# Patient Record
Sex: Male | Born: 1950
Health system: Southern US, Community
[De-identification: ages and names within clinical notes are randomized; demographics above are authoritative.]

## PROBLEM LIST (undated history)

## (undated) DIAGNOSIS — I639 Cerebral infarction, unspecified: Secondary | ICD-10-CM

## (undated) DIAGNOSIS — E119 Type 2 diabetes mellitus without complications: Secondary | ICD-10-CM

## (undated) DIAGNOSIS — I10 Essential (primary) hypertension: Secondary | ICD-10-CM

## (undated) DIAGNOSIS — K649 Unspecified hemorrhoids: Secondary | ICD-10-CM

## (undated) DIAGNOSIS — M1990 Unspecified osteoarthritis, unspecified site: Secondary | ICD-10-CM

## (undated) DIAGNOSIS — K219 Gastro-esophageal reflux disease without esophagitis: Secondary | ICD-10-CM

## (undated) DIAGNOSIS — E785 Hyperlipidemia, unspecified: Secondary | ICD-10-CM

## (undated) HISTORY — DX: Type 2 diabetes mellitus without complications: E11.9

## (undated) HISTORY — PX: TONSILLECTOMY: SUR1361

## (undated) HISTORY — DX: Essential (primary) hypertension: I10

## (undated) HISTORY — DX: Cerebral infarction, unspecified: I63.9

## (undated) HISTORY — DX: Unspecified osteoarthritis, unspecified site: M19.90

## (undated) HISTORY — DX: Gastro-esophageal reflux disease without esophagitis: K21.9

## (undated) HISTORY — DX: Hyperlipidemia, unspecified: E78.5

## (undated) HISTORY — DX: Unspecified hemorrhoids: K64.9

---

## 2014-10-27 ENCOUNTER — Encounter: Payer: Self-pay | Admitting: General Surgery

## 2014-10-27 ENCOUNTER — Ambulatory Visit (INDEPENDENT_AMBULATORY_CARE_PROVIDER_SITE_OTHER): Payer: Medicare Other | Admitting: General Surgery

## 2014-10-27 VITALS — BP 144/80 | HR 70 | Temp 97.1°F | Resp 12 | Ht 70.0 in | Wt 195.0 lb

## 2014-10-27 DIAGNOSIS — K610 Anal abscess: Secondary | ICD-10-CM

## 2014-10-27 MED ORDER — DOXYCYCLINE HYCLATE 100 MG PO CAPS: 100.0000 mg | ORAL_CAPSULE | Freq: Two times a day (BID) | ORAL | Status: DC

## 2014-10-27 NOTE — Patient Instructions (Addendum)
The patient is aware to call back for any questions or concerns. Soak in warm water- SITZ bath Use pad for drainage

## 2014-10-27 NOTE — Progress Notes (Signed)
Patient ID: Richard SCHLARB Sr., male   DOB: 04-Jun-1951, 63 y.o.   MRN: [id number]  Chief Complaint  Patient presents with  . Rectal Problems    abscess    HPI Richard MOFIELD Sr. is a 63 y.o. male.  Here today for evaluation of a perirectal abscess. States he has had trouble with this for about 1 week. He has had constipation and trouble with hemorrhoids in the past. No colonoscopy. He states it started draining around 4am.  HPI  Past Medical History  Diagnosis Date  . Arthritis   . GERD (gastroesophageal reflux disease)   . Diabetes mellitus without complication   . Hemorrhoid     Past Surgical History  Procedure Laterality Date  . Tonsillectomy      Family History  Problem Relation Age of Onset  . Cancer Father   . Cancer Mother   . Colon polyps Father     Social History History  Substance Use Topics  . Smoking status: Former Smoker    Quit date: 12/18/2005  . Smokeless tobacco: Not on file  . Alcohol Use: 0.0 oz/week    0 Not specified per week     Comment: occasionally    No Known Allergies  Current Outpatient Prescriptions  Medication Sig Dispense Refill  . ibuprofen (ADVIL,MOTRIN) 800 MG tablet Take 800 mg by mouth 2 (two) times daily.    . metFORMIN (GLUCOPHAGE) 500 MG tablet Take by mouth 2 (two) times daily with a meal.    . doxycycline (VIBRAMYCIN) 100 MG capsule Take 1 capsule (100 mg total) by mouth 2 (two) times daily. 14 capsule 0   No current facility-administered medications for this visit.    Review of Systems Review of Systems  Constitutional: Negative.   Respiratory: Negative.   Cardiovascular: Negative.     Blood pressure 144/80, pulse 70, temperature 97.1 F (36.2 C), resp. rate 12, height 5\' 10"  (1.778 m), weight 195 lb (88.451 kg).  Physical Exam Physical Exam  Constitutional: He appears well-developed and well-nourished.  Eyes: Conjunctivae are normal. No scleral icterus.  Neck: Neck supple.  Cardiovascular: Normal rate, regular  rhythm and normal heart sounds.   Pulmonary/Chest: Effort normal and breath sounds normal.  Abdominal: Soft. Normal appearance and bowel sounds are normal. There is no tenderness. No hernia.  Genitourinary:  Perianal area with mild swelling at 6-7 position, one open area in middle with drainage  Lymphadenopathy:    He has no cervical adenopathy.    Data Reviewed none  Assessment    Perianal abscess. Drained spontaneously.    Plan    Soak in warm water- SITZ bath BID. Use pad for drainage as needed. Follow up in 4-6 weeks. Complete antibiotic therapy-1 week of Doxycycline 100mg  bid  Plan for colonoscopy when area healed.     PCP:  Othelia Pulling Justain  Trica Usery G 10/27/2014, 8:40 AM

## 2014-11-18 ENCOUNTER — Ambulatory Visit (INDEPENDENT_AMBULATORY_CARE_PROVIDER_SITE_OTHER): Payer: Medicare Other | Admitting: General Surgery

## 2014-11-18 ENCOUNTER — Encounter: Payer: Self-pay | Admitting: General Surgery

## 2014-11-18 VITALS — BP 130/76 | HR 76 | Resp 12 | Ht 70.0 in | Wt 198.0 lb

## 2014-11-18 DIAGNOSIS — Z1211 Encounter for screening for malignant neoplasm of colon: Secondary | ICD-10-CM

## 2014-11-18 DIAGNOSIS — K603 Anal fistula: Secondary | ICD-10-CM

## 2014-11-18 DIAGNOSIS — K610 Anal abscess: Secondary | ICD-10-CM

## 2014-11-18 MED ORDER — POLYETHYLENE GLYCOL 3350 17 GM/SCOOP PO POWD
ORAL | Status: DC
Start: 2014-11-18 — End: 2015-04-30

## 2014-11-18 NOTE — Progress Notes (Signed)
Patient ID: Richard SWICEGOOD Sr., male   DOB: Aug 10, 1951, 63 y.o.   MRN: [id number]  Chief Complaint  Patient presents with  . Other    peri anal abscess    HPI Richard BANAS Sr. is a 63 y.o. male here today following up with a perianal abscess. Patient states the area began draining again Friday night. The area stopped  draining last night. Pain is significantly decreased from last visit. Patient has finished antibiotic                                                                    HPI  Past Medical History  Diagnosis Date  . Arthritis   . GERD (gastroesophageal reflux disease)   . Diabetes mellitus without complication   . Hemorrhoid     Past Surgical History  Procedure Laterality Date  . Tonsillectomy      Family History  Problem Relation Age of Onset  . Lung cancer Father   . Ovarian cancer Mother   . Colon polyps Father     Social History History  Substance Use Topics  . Smoking status: Former Smoker    Quit date: 12/18/2005  . Smokeless tobacco: Not on file  . Alcohol Use: 0.0 oz/week    0 Not specified per week     Comment: occasionally    No Known Allergies  Current Outpatient Prescriptions  Medication Sig Dispense Refill  . doxycycline (VIBRAMYCIN) 100 MG capsule Take 1 capsule (100 mg total) by mouth 2 (two) times daily. 14 capsule 0  . ibuprofen (ADVIL,MOTRIN) 800 MG tablet Take 800 mg by mouth 2 (two) times daily.    . metFORMIN (GLUCOPHAGE) 500 MG tablet Take by mouth 2 (two) times daily with a meal.     No current facility-administered medications for this visit.    Review of Systems Review of Systems  Constitutional: Negative.   Respiratory: Negative.     Blood pressure 130/76, pulse 76, resp. rate 12, height 5\' 10"  (1.778 m), weight 198 lb (89.812 kg).  Physical Exam Physical Exam  Constitutional: He is oriented to person, place, and time. He appears well-developed and well-nourished.  Eyes: Conjunctivae are normal. No scleral icterus.   Neck: Neck supple.  Cardiovascular: Normal rate, regular rhythm and normal heart sounds.   Pulmonary/Chest: Effort normal and breath sounds normal.  Abdominal: Soft. Bowel sounds are normal.  Genitourinary:  Small opening at Levasy. One cm from anal os Scant induration underlying this  Neurological: He is alert and oriented to person, place, and time.  Skin: Skin is warm and dry.    Data Reviewed Notes reviewed  Assessment    Anal fistula likely superficial   patient has not had a screening colonoscopy. I feel this can be done at same time as anal fistulotomy  Plan     Patient to have colonoscopy and anal fistulotomy. Both to be completed in the O.R.  Discussed risks and benefits and patient is agreeable to this. Procedure explained to him  Patient has been scheduled for a colonoscopy and surgery on 12-04-14 at [facility]. This patient has been asked to hold metformin day of colonoscopy prep and procedure.        Aubryana Vittorio G 11/18/2014, 2:11 PM

## 2014-11-18 NOTE — Patient Instructions (Addendum)
Colonoscopy A colonoscopy is an exam to look at the entire large intestine (colon). This exam can help find problems such as tumors, polyps, inflammation, and areas of bleeding. The exam takes about 1 hour.  LET [facility] CARE PROVIDER KNOW ABOUT:   Any allergies you have.  All medicines you are taking, including vitamins, herbs, eye drops, creams, and over-the-counter medicines.  Previous problems you or members of your family have had with the use of anesthetics.  Any blood disorders you have.  Previous surgeries you have had.  Medical conditions you have. RISKS AND COMPLICATIONS  Generally, this is a safe procedure. However, as with any procedure, complications can occur. Possible complications include:  Bleeding.  Tearing or rupture of the colon wall.  Reaction to medicines given during the exam.  Infection (rare). BEFORE THE PROCEDURE   Ask your health care provider about changing or stopping your regular medicines.  You may be prescribed an oral bowel prep. This involves drinking a large amount of medicated liquid, starting the day before your procedure. The liquid will cause you to have multiple loose stools until your stool is almost clear or light green. This cleans out your colon in preparation for the procedure.  Do not eat or drink anything else once you have started the bowel prep, unless your health care provider tells you it is safe to do so.  Arrange for someone to drive you home after the procedure. PROCEDURE   You will be given medicine to help you relax (sedative).  You will lie on your side with your knees bent.  A long, flexible tube with a light and camera on the end (colonoscope) will be inserted through the rectum and into the colon. The camera sends video back to a computer screen as it moves through the colon. The colonoscope also releases carbon dioxide gas to inflate the colon. This helps your health care provider see the area better.  During  the exam, your health care provider may take a small tissue sample (biopsy) to be examined under a microscope if any abnormalities are found.  The exam is finished when the entire colon has been viewed. AFTER THE PROCEDURE   Do not drive for 24 hours after the exam.  You may have a small amount of blood in your stool.  You may pass moderate amounts of gas and have mild abdominal cramping or bloating. This is caused by the gas used to inflate your colon during the exam.  Ask when your test results will be ready and how you will get your results. Make sure you get your test results. Document Released: 12/01/2000 Document Revised: 09/24/2013 Document Reviewed: 08/11/2013 [facility] Patient Information 2015 [place], [place]. This information is not intended to replace advice given to you by your health care provider. Make sure you discuss any questions you have with your health care provider.  Patient has been scheduled for a colonoscopy and surgery on 12-04-14 at [facility]. This patient has been asked to hold metformin day of colonoscopy prep and procedure.

## 2014-11-24 ENCOUNTER — Ambulatory Visit: Payer: Medicare Other | Admitting: General Surgery

## 2014-11-26 ENCOUNTER — Ambulatory Visit: Payer: Self-pay | Admitting: General Surgery

## 2014-11-26 LAB — BASIC METABOLIC PANEL
Anion Gap: 9 (ref 7–16)
BUN: 12 mg/dL (ref 7–18)
CALCIUM: 8.8 mg/dL (ref 8.5–10.1)
CHLORIDE: 101 mmol/L (ref 98–107)
Co2: 26 mmol/L (ref 21–32)
Creatinine: 0.98 mg/dL (ref 0.60–1.30)
EGFR (African American): >60
EGFR (Non-African Amer.): >60
GLUCOSE: 300 mg/dL — AB (ref 65–99)
Osmolality: 283 (ref 275–301)
Potassium: 4.4 mmol/L (ref 3.5–5.1)
Sodium: 136 mmol/L (ref 136–145)

## 2014-11-30 ENCOUNTER — Telehealth: Payer: Self-pay | Admitting: *Deleted

## 2014-11-30 NOTE — Telephone Encounter (Signed)
Per Judeen Hammans with anesthesia, patient will require medical clearance from Othelia Pulling, PA before we can proceed with colonoscopy and anal fistulotomy scheduled for 12-04-14. This is due to recent abnormal labs completed at time of pre-admission on 11-26-14. Glucose was noted to be 300 mg/dL.  Othelia Pulling, PA was also contacted this morning to confirm he received paperwork for medical clearance request and he confirms. He recommends that we cancel surgery at this time as patient will most likely not be cleared for surgery for Friday of this week.   Message was left on the O. R. posting line to cancel surgery for 12-04-14.  Patient was contacted this morning and made aware accordingly. This patient was instructed to follow up with his PCP. Also, aware we will need clearance before surgery can be rescheduled. Patient verbalizes understanding.

## 2014-12-30 ENCOUNTER — Encounter: Payer: Self-pay | Admitting: General Surgery

## 2015-02-23 ENCOUNTER — Telehealth: Payer: Self-pay | Admitting: *Deleted

## 2015-02-23 NOTE — Telephone Encounter (Signed)
Pts friend called and was wondering why there was not surgery schedule for the patient, I told her the reason and they both understand that he needs to follow up with his PCP and we will also need clearance before we can reschedule his surgery

## 2015-04-21 ENCOUNTER — Ambulatory Visit (INDEPENDENT_AMBULATORY_CARE_PROVIDER_SITE_OTHER): Payer: Medicare Other | Admitting: General Surgery

## 2015-04-21 ENCOUNTER — Encounter: Payer: Self-pay | Admitting: General Surgery

## 2015-04-21 VITALS — BP 148/84 | HR 80 | Resp 12 | Ht 70.0 in | Wt 199.0 lb

## 2015-04-21 DIAGNOSIS — K603 Anal fistula: Secondary | ICD-10-CM

## 2015-04-21 DIAGNOSIS — Z8371 Family history of colonic polyps: Secondary | ICD-10-CM | POA: Diagnosis not present

## 2015-04-21 NOTE — Progress Notes (Signed)
Patient ID: Richard DEBRUYNE Sr., male   DOB: 06/30/1951, 64 y.o.   MRN: [id number]  Chief Complaint  Patient presents with  . Pre-op Exam    HPI Richard ERAZO Sr. is a 64 y.o. male.  Here today for pre-op colonoscopy and anal fistulotomy scheduled for 04-30-15. He was initially scheduled for this in Dec 2015 but he had poor control of diabetes at that time.FSBS have been stable- average 128 in the mornings. He went fishing last week and started bleeding again. Otherwise he is doing well  HPI  Past Medical History  Diagnosis Date  . Arthritis   . GERD (gastroesophageal reflux disease)   . Diabetes mellitus without complication   . Hemorrhoid     Past Surgical History  Procedure Laterality Date  . Tonsillectomy      Family History  Problem Relation Age of Onset  . Lung cancer Father   . Ovarian cancer Mother   . Colon polyps Father     Social History History  Substance Use Topics  . Smoking status: Former Smoker    Quit date: 12/18/2005  . Smokeless tobacco: Never Used  . Alcohol Use: 0.0 oz/week    0 Standard drinks or equivalent per week     Comment: occasionally    No Known Allergies  Current Outpatient Prescriptions  Medication Sig Dispense Refill  . ibuprofen (ADVIL,MOTRIN) 800 MG tablet Take 800 mg by mouth 2 (two) times daily.    . metFORMIN (GLUCOPHAGE) 500 MG tablet Take 1,000 mg by mouth 2 (two) times daily with a meal.     . polyethylene glycol powder (GLYCOLAX/MIRALAX) powder 255 grams one bottle for colonoscopy prep 255 g 0   No current facility-administered medications for this visit.    Review of Systems Review of Systems  Constitutional: Negative.   Respiratory: Negative.   Cardiovascular: Negative.     Blood pressure 148/84, pulse 80, resp. rate 12, height 5\' 10"  (1.778 m), weight 199 lb (90.266 kg).  Physical Exam Physical Exam  Constitutional: He is oriented to person, place, and time. He appears well-developed and well-nourished.  Eyes:  Conjunctivae are normal. No scleral icterus.  Neck: Neck supple.  Cardiovascular: Normal rate, regular rhythm and normal heart sounds.   Pulmonary/Chest: Effort normal and breath sounds normal.  Abdominal: Soft. Normal appearance. There is no tenderness. No hernia.  Genitourinary:  External opening is again noted at 6-7 o'clock, position fairly close to anal opening.  Lymphadenopathy:    He has no cervical adenopathy.  Neurological: He is alert and oriented to person, place, and time.  Skin: Skin is warm and dry.    Data Reviewed Prior notes.  Assessment    Anal fistula, family history colon polyps. Diabetes under better control at present being followed by Othelia Pulling PA.    Plan    Needs screening colonosocpy and anal fistula surgery at same sitting in the O.R. This has previously been discussed with him.  Patient's procedure has been scheduled for 04-30-15 at [facility].      PCP:  Charm Rings 04/21/2015, 10:15 AM

## 2015-04-21 NOTE — Patient Instructions (Addendum)
Colonoscopy A colonoscopy is an exam to look at the entire large intestine (colon). This exam can help find problems such as tumors, polyps, inflammation, and areas of bleeding. The exam takes about 1 hour.  LET [facility] CARE PROVIDER KNOW ABOUT:   Any allergies you have.  All medicines you are taking, including vitamins, herbs, eye drops, creams, and over-the-counter medicines.  Previous problems you or members of your family have had with the use of anesthetics.  Any blood disorders you have.  Previous surgeries you have had.  Medical conditions you have. RISKS AND COMPLICATIONS  Generally, this is a safe procedure. However, as with any procedure, complications can occur. Possible complications include:  Bleeding.  Tearing or rupture of the colon wall.  Reaction to medicines given during the exam.  Infection (rare). BEFORE THE PROCEDURE   Ask your health care provider about changing or stopping your regular medicines.  You may be prescribed an oral bowel prep. This involves drinking a large amount of medicated liquid, starting the day before your procedure. The liquid will cause you to have multiple loose stools until your stool is almost clear or light green. This cleans out your colon in preparation for the procedure.  Do not eat or drink anything else once you have started the bowel prep, unless your health care provider tells you it is safe to do so.  Arrange for someone to drive you home after the procedure. PROCEDURE   You will be given medicine to help you relax (sedative).  You will lie on your side with your knees bent.  A long, flexible tube with a light and camera on the end (colonoscope) will be inserted through the rectum and into the colon. The camera sends video back to a computer screen as it moves through the colon. The colonoscope also releases carbon dioxide gas to inflate the colon. This helps your health care provider see the area better.  During  the exam, your health care provider may take a small tissue sample (biopsy) to be examined under a microscope if any abnormalities are found.  The exam is finished when the entire colon has been viewed. AFTER THE PROCEDURE   Do not drive for 24 hours after the exam.  You may have a small amount of blood in your stool.  You may pass moderate amounts of gas and have mild abdominal cramping or bloating. This is caused by the gas used to inflate your colon during the exam.  Ask when your test results will be ready and how you will get your results. Make sure you get your test results. Document Released: 12/01/2000 Document Revised: 09/24/2013 Document Reviewed: 08/11/2013 [facility] Patient Information 2015 [place], [place]. This information is not intended to replace advice given to you by your health care provider. Make sure you discuss any questions you have with your health care provider.  Patient's procedure has been scheduled for 04-30-15 at [facility].

## 2015-04-22 ENCOUNTER — Encounter
Admission: RE | Admit: 2015-04-22 | Discharge: 2015-04-22 | Disposition: A | Discharge status: Home / Self Care | Payer: Medicare Other | Source: Ambulatory Visit | Attending: General Surgery | Admitting: General Surgery

## 2015-04-22 ENCOUNTER — Other Ambulatory Visit: Payer: Self-pay | Admitting: General Surgery

## 2015-04-22 DIAGNOSIS — Z01812 Encounter for preprocedural laboratory examination: Secondary | ICD-10-CM | POA: Insufficient documentation

## 2015-04-22 DIAGNOSIS — E119 Type 2 diabetes mellitus without complications: Secondary | ICD-10-CM | POA: Insufficient documentation

## 2015-04-22 LAB — BASIC METABOLIC PANEL
ANION GAP: 11 (ref 5–15)
BUN: 18 mg/dL (ref 6–20)
CHLORIDE: 100 mmol/L — AB (ref 101–111)
CO2: 25 mmol/L (ref 22–32)
Calcium: 9.4 mg/dL (ref 8.9–10.3)
Creatinine, Ser: 0.94 mg/dL (ref 0.61–1.24)
GFR calc Af Amer: >60 mL/min (ref >60)
GFR calc non Af Amer: >60 mL/min (ref >60)
Glucose, Bld: 147 mg/dL — ABNORMAL HIGH (ref 65–99)
POTASSIUM: 4.4 mmol/L (ref 3.5–5.1)
Sodium: 136 mmol/L (ref 135–145)

## 2015-04-30 ENCOUNTER — Ambulatory Visit: Payer: Medicare Other | Admitting: Anesthesiology

## 2015-04-30 ENCOUNTER — Encounter: Payer: Self-pay | Admitting: *Deleted

## 2015-04-30 ENCOUNTER — Ambulatory Visit
Admission: RE | Admit: 2015-04-30 | Discharge: 2015-04-30 | Disposition: A | Discharge status: Home / Self Care | Payer: Medicare Other | Source: Ambulatory Visit | Attending: General Surgery | Admitting: General Surgery

## 2015-04-30 ENCOUNTER — Encounter
Admission: RE | Disposition: A | Discharge status: Home / Self Care | Payer: Self-pay | Source: Ambulatory Visit | Attending: General Surgery

## 2015-04-30 DIAGNOSIS — Z800 Family history of malignant neoplasm of digestive organs: Secondary | ICD-10-CM | POA: Insufficient documentation

## 2015-04-30 DIAGNOSIS — Z801 Family history of malignant neoplasm of trachea, bronchus and lung: Secondary | ICD-10-CM | POA: Diagnosis not present

## 2015-04-30 DIAGNOSIS — Z1211 Encounter for screening for malignant neoplasm of colon: Secondary | ICD-10-CM | POA: Insufficient documentation

## 2015-04-30 DIAGNOSIS — E1165 Type 2 diabetes mellitus with hyperglycemia: Secondary | ICD-10-CM | POA: Diagnosis not present

## 2015-04-30 DIAGNOSIS — D123 Benign neoplasm of transverse colon: Secondary | ICD-10-CM | POA: Insufficient documentation

## 2015-04-30 DIAGNOSIS — K219 Gastro-esophageal reflux disease without esophagitis: Secondary | ICD-10-CM | POA: Diagnosis not present

## 2015-04-30 DIAGNOSIS — M1990 Unspecified osteoarthritis, unspecified site: Secondary | ICD-10-CM | POA: Diagnosis not present

## 2015-04-30 DIAGNOSIS — K603 Anal fistula: Secondary | ICD-10-CM | POA: Insufficient documentation

## 2015-04-30 DIAGNOSIS — Z8371 Family history of colonic polyps: Secondary | ICD-10-CM | POA: Diagnosis not present

## 2015-04-30 DIAGNOSIS — Z87891 Personal history of nicotine dependence: Secondary | ICD-10-CM | POA: Diagnosis not present

## 2015-04-30 DIAGNOSIS — K621 Rectal polyp: Secondary | ICD-10-CM | POA: Insufficient documentation

## 2015-04-30 DIAGNOSIS — Z79899 Other long term (current) drug therapy: Secondary | ICD-10-CM | POA: Insufficient documentation

## 2015-04-30 HISTORY — PX: COLONOSCOPY: SHX5424

## 2015-04-30 HISTORY — PX: ANAL FISTULOTOMY: SHX6423

## 2015-04-30 LAB — GLUCOSE, CAPILLARY
GLUCOSE-CAPILLARY: 154 mg/dL — AB (ref 65–99)
Glucose-Capillary: 138 mg/dL — ABNORMAL HIGH (ref 65–99)

## 2015-04-30 SURGERY — COLONOSCOPY
Anesthesia: General

## 2015-04-30 MED ORDER — BUPIVACAINE HCL (PF) 0.5 % IJ SOLN
INTRAMUSCULAR | Status: AC
  Filled 2015-04-30: qty 30

## 2015-04-30 MED ORDER — OXYCODONE-ACETAMINOPHEN 5-325 MG PO TABS: 1.0000 | ORAL_TABLET | ORAL | Status: DC | PRN

## 2015-04-30 MED ORDER — ACETAMINOPHEN 10 MG/ML IV SOLN
INTRAVENOUS | Status: AC
  Administered 2015-04-30: 1000 mg via INTRAVENOUS
  Filled 2015-04-30: qty 100

## 2015-04-30 MED ORDER — FENTANYL CITRATE (PF) 100 MCG/2ML IJ SOLN
25.0000 ug | INTRAMUSCULAR | Status: DC | PRN
  Administered 2015-04-30 (×2): 25 ug via INTRAVENOUS

## 2015-04-30 MED ORDER — FENTANYL CITRATE (PF) 100 MCG/2ML IJ SOLN
INTRAMUSCULAR | Status: DC | PRN
  Administered 2015-04-30 (×3): 50 ug via INTRAVENOUS

## 2015-04-30 MED ORDER — BUPIVACAINE LIPOSOME 1.3 % IJ SUSP
INTRAMUSCULAR | Status: AC
  Filled 2015-04-30: qty 20

## 2015-04-30 MED ORDER — BUPIVACAINE LIPOSOME 1.3 % IJ SUSP
INTRAMUSCULAR | Status: DC | PRN
  Administered 2015-04-30: 20 mL

## 2015-04-30 MED ORDER — ONDANSETRON HCL 4 MG/2ML IJ SOLN
INTRAMUSCULAR | Status: AC
  Filled 2015-04-30: qty 2

## 2015-04-30 MED ORDER — SODIUM CHLORIDE 0.9 % IV SOLN
INTRAVENOUS | Status: DC
  Administered 2015-04-30: 07:00:00 via INTRAVENOUS

## 2015-04-30 MED ORDER — DEXAMETHASONE SODIUM PHOSPHATE 10 MG/ML IJ SOLN
INTRAMUSCULAR | Status: DC | PRN
  Administered 2015-04-30: 5 mg via INTRAVENOUS

## 2015-04-30 MED ORDER — ONDANSETRON HCL 4 MG/2ML IJ SOLN
INTRAMUSCULAR | Status: DC | PRN
  Administered 2015-04-30: 4 mg via INTRAVENOUS

## 2015-04-30 MED ORDER — PHENYLEPHRINE HCL 10 MG/ML IJ SOLN
INTRAMUSCULAR | Status: DC | PRN
  Administered 2015-04-30: 200 ug via INTRAVENOUS

## 2015-04-30 MED ORDER — ONDANSETRON HCL 4 MG/2ML IJ SOLN
4.0000 mg | Freq: Once | INTRAMUSCULAR | Status: AC | PRN
  Administered 2015-04-30: 4 mg via INTRAVENOUS

## 2015-04-30 MED ORDER — LIDOCAINE HCL (PF) 1 % IJ SOLN
INTRAMUSCULAR | Status: AC
  Filled 2015-04-30: qty 30

## 2015-04-30 MED ORDER — BACIT-POLY-NEO HC 1 % EX OINT
TOPICAL_OINTMENT | CUTANEOUS | Status: DC | PRN
  Administered 2015-04-30: 1 via TOPICAL

## 2015-04-30 MED ORDER — LIDOCAINE HCL (CARDIAC) 20 MG/ML IV SOLN
INTRAVENOUS | Status: DC | PRN
  Administered 2015-04-30: 50 mg via INTRAVENOUS

## 2015-04-30 MED ORDER — FENTANYL CITRATE (PF) 100 MCG/2ML IJ SOLN
INTRAMUSCULAR | Status: AC
  Administered 2015-04-30: 25 ug via INTRAVENOUS
  Filled 2015-04-30: qty 2

## 2015-04-30 MED ORDER — BACITRACIN ZINC 500 UNIT/GM EX OINT
TOPICAL_OINTMENT | CUTANEOUS | Status: AC
  Filled 2015-04-30: qty 28.35

## 2015-04-30 MED ORDER — PROPOFOL 10 MG/ML IV BOLUS
INTRAVENOUS | Status: DC | PRN
  Administered 2015-04-30: 150 mg via INTRAVENOUS
  Administered 2015-04-30: 50 mg via INTRAVENOUS

## 2015-04-30 MED ORDER — MIDAZOLAM HCL 5 MG/5ML IJ SOLN
INTRAMUSCULAR | Status: DC | PRN
  Administered 2015-04-30: 1 mg via INTRAVENOUS

## 2015-04-30 MED ORDER — LACTATED RINGERS IV SOLN
INTRAVENOUS | Status: DC | PRN
  Administered 2015-04-30 (×2): via INTRAVENOUS

## 2015-04-30 SURGICAL SUPPLY — 24 items
BLADE SURG 11 STRL SS SAFETY (MISCELLANEOUS) ×3 IMPLANT
BLADE SURG 15 STRL SS SAFETY (BLADE) ×3 IMPLANT
BRIEF STRETCH MATERNITY 2XLG (MISCELLANEOUS) ×3 IMPLANT
CANISTER SUCT 1200ML W/VALVE (MISCELLANEOUS) ×3 IMPLANT
DRAPE LEGGINS SURG 28X43 STRL (DRAPES) ×3 IMPLANT
DRAPE PED LAPAROTOMY (DRAPES) ×3 IMPLANT
DRAPE UNDER BUTTOCK W/FLU (DRAPES) ×3 IMPLANT
GLOVE BIO SURGEON STRL SZ7 (GLOVE) ×9 IMPLANT
GOWN STRL REUS W/ TWL LRG LVL3 (GOWN DISPOSABLE) ×2 IMPLANT
GOWN STRL REUS W/TWL LRG LVL3 (GOWN DISPOSABLE) ×4
JELLY LUB 2OZ STRL (MISCELLANEOUS) ×2
JELLY LUBE 2OZ STRL (MISCELLANEOUS) ×1 IMPLANT
LABEL OR SOLS (LABEL) ×3 IMPLANT
NDL SAFETY 25GX1.5 (NEEDLE) ×3 IMPLANT
PACK BASIN MINOR ARMC (MISCELLANEOUS) ×3 IMPLANT
PAD GROUND ADULT SPLIT (MISCELLANEOUS) ×3 IMPLANT
PAD OB MATERNITY 4.3X12.25 (PERSONAL CARE ITEMS) ×3 IMPLANT
PAD PREP 24X41 OB/GYN DISP (PERSONAL CARE ITEMS) ×3 IMPLANT
SOL PREP PVP 2OZ (MISCELLANEOUS) ×3
SOLUTION PREP PVP 2OZ (MISCELLANEOUS) ×1 IMPLANT
STRAP SAFETY BODY (MISCELLANEOUS) ×3 IMPLANT
SUT VIC AB 3-0 SH 27 (SUTURE) ×2
SUT VIC AB 3-0 SH 27X BRD (SUTURE) ×1 IMPLANT
SYR CONTROL 10ML (SYRINGE) ×3 IMPLANT

## 2015-04-30 NOTE — Op Note (Signed)
Preop diagnosis anal fistula and need for screening colonoscopy  Post op diagnosis: Same  Operation: 1 colonoscopy and polypectomy. 2 anal fistulotomy  Anesthesia: Gen.  Complications: None  EBL: Minimal  Drains: None  Description: Colonoscopy was performed first after the patient was put to sleep with an LMA. Time out was performed both before colonoscopy and before the anal fistulotomy. The colonoscopy notice already been dictated the probation. Patient had 2 small rectal polyps which were hot biopsied and a small polyp in the proximal transverse colon that was dissected with a snare. Patient then was placed in a supine position and in the lithotomy position on the operating table. Anal area was inspected and was noted that the patient had a external skin opening with some unhealthy granulation tissue located 7:00 position just a centimeter away from the anal opening of the internal sphincter edge. Using a probe the tract was noted to be going just in through the very mild outermost internal sphincter muscle and did not involve the intersphincteric area. The tract was only about a centimeter and a half in length and after this was adequately exposed it was decided to do a fistulotomy taking only the very edge of the internal sphincter muscle. This was done with cautery. After ensuring hemostasis the perianal region and a fistula side were instilled with 20 mL of Exparel. The open area was coated with bacitracin ointment. Patient subsequently was extubated and returned recovery room in stable condition

## 2015-04-30 NOTE — Interval H&P Note (Signed)
History and Physical Interval Note:  04/30/2015 7:08 AM  Richard Russian Sr.  has presented today for surgery, with the diagnosis of SCREENING COLON,ANAL FISTULA  The various methods of treatment have been discussed with the patient and family. After consideration of risks, benefits and other options for treatment, the patient has consented to  Procedure(s): COLONOSCOPY (N/A) ANAL FISTULOTOMY (N/A) as a surgical intervention .  The patient's history has been reviewed, patient examined, no change in status, stable for surgery.  I have reviewed the patient's chart and labs.  Questions were answered to the patient's satisfaction.     Steffon Gladu G

## 2015-04-30 NOTE — Anesthesia Procedure Notes (Signed)
Procedure Name: LMA Insertion Performed by: Kennon Holter Pre-anesthesia Checklist: Patient identified, Emergency Drugs available, Suction available, Patient being monitored and Timeout performed Patient Re-evaluated:Patient Re-evaluated prior to inductionOxygen Delivery Method: Circle system utilized Preoxygenation: Pre-oxygenation with 100% oxygen Intubation Type: IV induction Ventilation: Mask ventilation without difficulty LMA: LMA inserted LMA Size: 5.0 Tube type: Oral Number of attempts: 1 Placement Confirmation: positive ETCO2,  CO2 detector and breath sounds checked- equal and bilateral Dental Injury: Teeth and Oropharynx as per pre-operative assessment

## 2015-04-30 NOTE — H&P (View-Only) (Signed)
Patient ID: Richard SELVEY Sr., male   DOB: 11/17/1951, 64 y.o.   MRN: [id number]  Chief Complaint  Patient presents with  . Pre-op Exam    HPI Richard LAZALDE Sr. is a 64 y.o. male.  Here today for pre-op colonoscopy and anal fistulotomy scheduled for 04-30-15. He was initially scheduled for this in Dec 2015 but he had poor control of diabetes at that time.FSBS have been stable- average 128 in the mornings. He went fishing last week and started bleeding again. Otherwise he is doing well  HPI  Past Medical History  Diagnosis Date  . Arthritis   . GERD (gastroesophageal reflux disease)   . Diabetes mellitus without complication   . Hemorrhoid     Past Surgical History  Procedure Laterality Date  . Tonsillectomy      Family History  Problem Relation Age of Onset  . Lung cancer Father   . Ovarian cancer Mother   . Colon polyps Father     Social History History  Substance Use Topics  . Smoking status: Former Smoker    Quit date: 12/18/2005  . Smokeless tobacco: Never Used  . Alcohol Use: 0.0 oz/week    0 Standard drinks or equivalent per week     Comment: occasionally    No Known Allergies  Current Outpatient Prescriptions  Medication Sig Dispense Refill  . ibuprofen (ADVIL,MOTRIN) 800 MG tablet Take 800 mg by mouth 2 (two) times daily.    . metFORMIN (GLUCOPHAGE) 500 MG tablet Take 1,000 mg by mouth 2 (two) times daily with a meal.     . polyethylene glycol powder (GLYCOLAX/MIRALAX) powder 255 grams one bottle for colonoscopy prep 255 g 0   No current facility-administered medications for this visit.    Review of Systems Review of Systems  Constitutional: Negative.   Respiratory: Negative.   Cardiovascular: Negative.     Blood pressure 148/84, pulse 80, resp. rate 12, height 5\' 10"  (1.778 m), weight 199 lb (90.266 kg).  Physical Exam Physical Exam  Constitutional: He is oriented to person, place, and time. He appears well-developed and well-nourished.  Eyes:  Conjunctivae are normal. No scleral icterus.  Neck: Neck supple.  Cardiovascular: Normal rate, regular rhythm and normal heart sounds.   Pulmonary/Chest: Effort normal and breath sounds normal.  Abdominal: Soft. Normal appearance. There is no tenderness. No hernia.  Genitourinary:  External opening is again noted at 6-7 o'clock, position fairly close to anal opening.  Lymphadenopathy:    He has no cervical adenopathy.  Neurological: He is alert and oriented to person, place, and time.  Skin: Skin is warm and dry.    Data Reviewed Prior notes.  Assessment    Anal fistula, family history colon polyps. Diabetes under better control at present being followed by Othelia Pulling PA.    Plan    Needs screening colonosocpy and anal fistula surgery at same sitting in the O.R. This has previously been discussed with him.  Patient's procedure has been scheduled for 04-30-15 at [facility].      PCP:  Charm Rings 04/21/2015, 10:15 AM

## 2015-04-30 NOTE — Transfer of Care (Signed)
Immediate Anesthesia Transfer of Care Note  Patient: Richard LARIN Sr.  Procedure(s) Performed: Procedure(s): COLONOSCOPY (N/A) ANAL FISTULOTOMY (N/A)  Patient Location: PACU  Anesthesia Type:General  Level of Consciousness: awake, alert  and oriented  Airway & Oxygen Therapy: Patient Spontanous Breathing and Patient connected to face mask oxygen  Post-op Assessment: Report given to RN and Post -op Vital signs reviewed and stable  Post vital signs: stable  Last Vitals:  Filed Vitals:   04/30/15 0848  BP: 123/63  Pulse: 65  Temp: 36.1 C  Resp:     Complications: No apparent anesthesia complications

## 2015-04-30 NOTE — Anesthesia Preprocedure Evaluation (Signed)
Anesthesia Evaluation  Patient identified by MRN, date of birth, ID band Patient awake    Reviewed: Allergy & Precautions, NPO status , Patient's Chart, lab work & pertinent test results  Airway Mallampati: III  TM Distance: >3 FB Neck ROM: Full    Dental  (+) Chipped   Pulmonary former smoker,          Cardiovascular     Neuro/Psych    GI/Hepatic   Endo/Other  diabetes  Renal/GU      Musculoskeletal  (+) Arthritis -,   Abdominal   Peds  Hematology   Anesthesia Other Findings   Reproductive/Obstetrics                             Anesthesia Physical Anesthesia Plan  ASA: III  Anesthesia Plan: General   Post-op Pain Management:    Induction: Intravenous  Airway Management Planned: LMA  Additional Equipment:   Intra-op Plan:   Post-operative Plan: Extubation in OR  Informed Consent: I have reviewed the patients History and Physical, chart, labs and discussed the procedure including the risks, benefits and alternatives for the proposed anesthesia with the patient or authorized representative who has indicated his/her understanding and acceptance.     Plan Discussed with: CRNA  Anesthesia Plan Comments:         Anesthesia Quick Evaluation

## 2015-04-30 NOTE — Op Note (Signed)
[facility]ology Patient Name: Richard Thornton Procedure Date: 04/30/2015 7:23 AM MRN: [id number] Account #: [id number] Date of Birth: 1950-12-23 Admit Type: Outpatient Age: 64 Room: [facility] OR ROOM 1 Gender: Male Note Status: Finalized Procedure:         Colonoscopy Indications:       Screening for colorectal malignant neoplasm Providers:         Orlie Pollen, MD Medicines:         General Anesthesia Complications:     No immediate complications. Procedure:         Pre-Anesthesia Assessment:                    - General anesthesia under the supervision of an                     anesthesiologist was determined to be medically necessary                     for this procedure based on review of the patient's                     medical history, medications, and prior anesthesia history.                    After obtaining informed consent, the colonoscope was                     passed under direct vision. Throughout the procedure, the                     patient's blood pressure, pulse, and oxygen saturations                     were monitored continuously. The Colonoscope was                     introduced through the anus and advanced to the the cecum,                     identified by the ileocecal valve. The patient tolerated                     the procedure well. The quality of the bowel preparation                     was good. Findings:      Two sessile polyps were found in the rectum. The polyps were 5 mm in       size. These were biopsied with a hot forceps for histology.      A 5 mm polyp was found in the proximal transverse colon. The polyp was       sessile. The polyp was removed with a hot snare. Resection and retrieval       were complete.      The exam was otherwise without abnormality on direct and retroflexion       views. Impression:        - Two 5 mm polyps in the rectum. Biopsied.                    - One 5 mm polyp in the  proximal transverse colon.                     Resected  and retrieved.                    - The examination was otherwise normal on direct and                     retroflexion views. Recommendation:    - Repeat colonoscopy in 5 years for surveillance. Procedure Code(s): --- Professional ---                    [phone], Colonoscopy, flexible; with removal of tumor(s),                     polyp(s), or other lesion(s) by snare technique                    45384, 59, Colonoscopy, flexible; with removal of                     tumor(s), polyp(s), or other lesion(s) by hot biopsy                     forceps Diagnosis Code(s): --- Professional ---                    Z12.11, Encounter for screening for malignant neoplasm of                     colon                    K62.1, Rectal polyp                    D12.3, Benign neoplasm of transverse colon CPT copyright 2014 [employer]. All rights reserved. The codes documented in this report are preliminary and upon coder review may  be revised to meet current compliance requirements. Orlie Pollen, MD 04/30/2015 8:14:15 AM This report has been signed electronically. Number of Addenda: 0 Note Initiated On: 04/30/2015 7:23 AM Scope Withdrawal Time: 0 hours 11 minutes 15 seconds  Total Procedure Duration: 0 hours 30 minutes 42 seconds       [facility]

## 2015-04-30 NOTE — Discharge Instructions (Addendum)
Anal Fistula An anal fistula is an abnormal tunnel that develops between the bowel and skin near the outside of the anus, where feces comes out. The anus has a number of tiny glands that make lubricating fluid. Sometimes these glands can become plugged and infected. This may lead to the development of a fluid-filled pocket (abscess). An anal fistula often develops after this infection or abscess. It is nearly always caused by a past or current anal abscess.  CAUSES  Though an anal fistula is almost always caused by a past or current anal abscess, other causes can include:  A complication of surgery.  Trauma to the rectal area.  Radiation to the area.  Other medical conditions or diseases, such as:   Chronic inflammatory bowel disease, such as Crohn disease or ulcerative colitis.   Colon or rectal cancer.   Diverticular disease, such as diverticulitis.   A sexually transmitted disease, such as gonorrhea, chlamydia, or syphilis.  An HIV infection or AIDS.  SYMPTOMS   Throbbing or constant pain that may be worse when sitting.   Swelling or irritation around the anus.   Drainage of pus or blood from an opening near the anus.   Pain with bowel movements.  Fever or chills. DIAGNOSIS  Your caregiver will examine the area to find the openings of the anal fistula and the fistula tract. The external opening of the anal fistula may be seen during a physical examination. Other examinations that may be performed include:   Examination of the rectal area with a gloved hand (digital rectal exam).   Examination with a probe or scope to help locate the internal opening of the fistula.   Injection of a dye into the fistula opening. X-rays can be taken to find the exact location and path of the fistula.   An MRI or ultrasound of the anal area.  Other tests may be performed to find the cause of the anal fistula.   TREATMENT  The most common treatment for an anal fistula is  surgery. There are different surgery options depending on where your fistula is located and how complex the fistula is. Surgical options include:  A fistulotomy. This surgery involves opening up the whole fistula and draining the contents inside to promote healing.  Seton placement. A silk string (seton) is placed into the fistula during a fistulotomy to drain any infection to promote healing.  Advancement flap procedure. Tissue is removed from your rectum or the skin around the anus and is attached to the opening of the fistula.  Bioprosthetic plug. A cone-shaped plug is made from your tissue and is used to block the opening of the fistula. Some anal fistulas do not require surgery. A fibrin glue is a non-surgical option that involves injecting the glue to seal the fistula. You also may be prescribed an antibiotic medicine to treat an infection.  HOME CARE INSTRUCTIONS   Take your antibiotics as directed. Finish them even if you start to feel better.  Only take over-the-counter or prescription medicines as directed by your caregiver.Use a stool softener or laxative, if recommended.   Eat a high-fiber diet to help avoid constipation or as directed by your caregiver.  Drink enough water to keep your urine clear or pale yellow.   A warm sitz bath may be soothing and help with healing. You may take warm sitz baths for 15-20 minutes, 3-4 times a day to ease pain and discomfort.   Follow excellent hygiene to keep the anal area  as clean and dry as possible. Use wet toilet paper or moist towelettes after each bowel movement.  SEEK MEDICAL CARE IF: You have increased pain not controlled with medicines.  SEEK IMMEDIATE MEDICAL CARE IF:  You have severe, intolerable pain.  You have new swelling, redness, or discharge around the anal area.  You have tenderness or warmth around the anal area.  You have chills or diarrhea.  You have severe problems urinating or having a bowel movement.    You have a fever or persistent symptoms for more than 2-3 days.   You have a fever and your symptoms suddenly get worse.  MAKE SURE YOU:   Understand these instructions.  Will watch your condition.  Will get help right away if you are not doing well or get worse. Document Released: 11/16/2008 Document Revised: 11/20/2012 Document Reviewed: 10/09/2011 [facility] Patient Information 2015 [place], [place]. This information is not intended to replace advice given to you by your health care provider. Make sure you discuss any questions you have with your health care provider. General Anesthesia, Care After Refer to this sheet in the next few weeks. These instructions provide you with information on caring for yourself after your procedure. Your health care provider may also give you more specific instructions. Your treatment has been planned according to current medical practices, but problems sometimes occur. Call your health care provider if you have any problems or questions after your procedure. WHAT TO EXPECT AFTER THE PROCEDURE After the procedure, it is typical to experience:  Sleepiness.  Nausea and vomiting. HOME CARE INSTRUCTIONS  For the first 24 hours after general anesthesia:  Have a responsible person with you.  Do not drive a car. If you are alone, do not take public transportation.  Do not drink alcohol.  Do not take medicine that has not been prescribed by your health care provider.  Do not sign important papers or make important decisions.  You may resume a normal diet and activities as directed by your health care provider.  Change bandages (dressings) as directed.  If you have questions or problems that seem related to general anesthesia, call the hospital and ask for the anesthetist or anesthesiologist on call. SEEK MEDICAL CARE IF:  You have nausea and vomiting that continue the day after anesthesia.  You develop a rash. SEEK IMMEDIATE MEDICAL CARE IF:    You have difficulty breathing.  You have chest pain.  You have any allergic problems. Document Released: 03/12/2001 Document Revised: 12/09/2013 Document Reviewed: 06/19/2013 [facility] Patient Information 2015 [place], [place]. This information is not intended to replace advice given to you by your health care provider. Make sure you discuss any questions you have with your health care provider.

## 2015-04-30 NOTE — Anesthesia Postprocedure Evaluation (Signed)
  Anesthesia Post-op Note  Patient: Richard Russian Sr.  Procedure(s) Performed: Procedure(s): COLONOSCOPY (N/A) ANAL FISTULOTOMY (N/A)  Anesthesia type:General  Patient location: PACU  Post pain: Pain level controlled  Post assessment: Post-op Vital signs reviewed, Patient's Cardiovascular Status Stable, Respiratory Function Stable, Patent Airway and No signs of Nausea or vomiting  Post vital signs: Reviewed and stable  Last Vitals:  Filed Vitals:   04/30/15 1007  BP: 174/89  Pulse: 65  Temp: 36.3 C  Resp: 16    Level of consciousness: awake, alert  and patient cooperative  Complications: No apparent anesthesia complications

## 2015-05-02 ENCOUNTER — Encounter: Payer: Self-pay | Admitting: General Surgery

## 2015-05-03 ENCOUNTER — Ambulatory Visit (INDEPENDENT_AMBULATORY_CARE_PROVIDER_SITE_OTHER): Payer: Medicare Other | Admitting: General Surgery

## 2015-05-03 ENCOUNTER — Encounter: Payer: Self-pay | Admitting: General Surgery

## 2015-05-03 VITALS — BP 138/78 | HR 68 | Resp 14 | Ht 70.0 in | Wt 195.0 lb

## 2015-05-03 DIAGNOSIS — Z86010 Personal history of colonic polyps: Secondary | ICD-10-CM

## 2015-05-03 DIAGNOSIS — K603 Anal fistula: Secondary | ICD-10-CM

## 2015-05-03 LAB — SURGICAL PATHOLOGY

## 2015-05-03 NOTE — Progress Notes (Signed)
This is a 64 year old male here today for his post op anal fistulotomy and colonoscopy done on 04/30/15. 3 tiny polyps were removed, 2 in rectum and on higher up. Fistula was shallow and short  Patient states he is doing well.  Incision looks clean and healing well.  Patient to return in one month.

## 2015-05-03 NOTE — Patient Instructions (Signed)
Activity as tolerated

## 2015-05-03 NOTE — Addendum Note (Signed)
Addendum  created 05/03/15 1053 by Kennon Holter, CRNA   Modules edited: Charges VN

## 2015-05-05 ENCOUNTER — Telehealth: Payer: Self-pay | Admitting: *Deleted

## 2015-05-05 NOTE — Telephone Encounter (Signed)
Please let pt know the pathology was normal. One of the three was a true polyp, no cancer in it. He will need colonoscopy in 5 years. Notified patient as instructed, patient pleased. Discussed follow-up appointments, patient agrees. Placed in recalls.

## 2015-06-08 ENCOUNTER — Ambulatory Visit: Payer: Medicare Other | Admitting: General Surgery

## 2015-07-01 ENCOUNTER — Encounter: Payer: Self-pay | Admitting: General Surgery

## 2015-07-01 ENCOUNTER — Ambulatory Visit (INDEPENDENT_AMBULATORY_CARE_PROVIDER_SITE_OTHER): Payer: Medicare Other | Admitting: General Surgery

## 2015-07-01 VITALS — BP 140/76 | HR 70 | Resp 14 | Ht 70.0 in | Wt 201.0 lb

## 2015-07-01 DIAGNOSIS — L0231 Cutaneous abscess of buttock: Secondary | ICD-10-CM | POA: Diagnosis not present

## 2015-07-01 MED ORDER — DOXYCYCLINE HYCLATE 100 MG PO CAPS: 100.0000 mg | ORAL_CAPSULE | Freq: Two times a day (BID) | ORAL | Status: DC

## 2015-07-01 NOTE — Addendum Note (Signed)
Addended by: Dominga Ferry on: 07/01/2015 10:18 AM   Modules accepted: Level of Service

## 2015-07-01 NOTE — Patient Instructions (Signed)
Patient to return as needed. Call with any problems

## 2015-07-01 NOTE — Progress Notes (Signed)
Patient ID: Richard POMPEY Sr., male   DOB: 01-24-1951, 64 y.o.   MRN: [id number]  Chief Complaint  Patient presents with  . Other    anal fistula    HPI Richard DOCKEN Sr. is a 64 y.o. male. here today following up from his anal fistula surgery done on 04/30/15. He states that area is doing well, no drainage. Patient states lately there has been some boils to pop up on the top of his thighs and on his cheek.Marland KitchenHPI  Past Medical History  Diagnosis Date  . Arthritis   . GERD (gastroesophageal reflux disease)   . Diabetes mellitus without complication   . Hemorrhoid     Past Surgical History  Procedure Laterality Date  . Tonsillectomy    . Colonoscopy N/A 04/30/2015    Procedure: COLONOSCOPY;  Surgeon: Christene Lye, MD;  Location: [facility] ORS;  Service: General;  Laterality: N/A;  . Anal fistulotomy N/A 04/30/2015    Procedure: ANAL FISTULOTOMY;  Surgeon: Christene Lye, MD;  Location: [facility] ORS;  Service: General;  Laterality: N/A;    Family History  Problem Relation Age of Onset  . Lung cancer Father   . Ovarian cancer Mother   . Colon polyps Father     Social History History  Substance Use Topics  . Smoking status: Former Smoker    Quit date: 12/18/2005  . Smokeless tobacco: Never Used  . Alcohol Use: 0.0 oz/week    0 Standard drinks or equivalent per week     Comment: occasionally    No Known Allergies  Current Outpatient Prescriptions  Medication Sig Dispense Refill  . metFORMIN (GLUCOPHAGE) 500 MG tablet Take 1,000 mg by mouth 2 (two) times daily with a meal.     . omeprazole (PRILOSEC OTC) 20 MG tablet Take 20 mg by mouth daily.    Marland Kitchen doxycycline (VIBRAMYCIN) 100 MG capsule Take 1 capsule (100 mg total) by mouth 2 (two) times daily. 20 capsule 0   No current facility-administered medications for this visit.    Review of Systems Review of Systems  Constitutional: Negative.   Respiratory: Negative.   Cardiovascular: Negative.   Gastrointestinal:  Negative.     Blood pressure 140/76, pulse 70, resp. rate 14, height 5\' 10"  (1.778 m), weight 201 lb (91.173 kg).  Physical Exam Physical Exam Fistulotomy is fully healed , there is a 2 cm red tender area with no drainage in the lower inner quadrant in the gluteal area Data Reviewed None  Assessment    Healed anal fistulotomy. Developing abscess gluteal area-unrelated to rectal region    Plan    Rx sent for doxycycline for 10 days   Patient to return as needed.  Rx sent   Christene Lye 07/01/2015, 10:16 AM

## 2017-08-30 ENCOUNTER — Emergency Department: Payer: Medicare Other

## 2017-08-30 ENCOUNTER — Observation Stay
Admission: EM | Admit: 2017-08-30 | Discharge: 2017-08-31 | Disposition: A | Discharge status: Home / Self Care | Payer: Medicare Other | Attending: Internal Medicine | Admitting: Internal Medicine

## 2017-08-30 ENCOUNTER — Observation Stay: Payer: Medicare Other

## 2017-08-30 ENCOUNTER — Encounter: Payer: Self-pay | Admitting: Emergency Medicine

## 2017-08-30 ENCOUNTER — Observation Stay (HOSPITAL_BASED_OUTPATIENT_CLINIC_OR_DEPARTMENT_OTHER)
Admit: 2017-08-30 | Discharge: 2017-08-30 | Disposition: A | Discharge status: Home / Self Care | Payer: Medicare Other | Attending: Internal Medicine | Admitting: Internal Medicine

## 2017-08-30 DIAGNOSIS — I5030 Unspecified diastolic (congestive) heart failure: Secondary | ICD-10-CM

## 2017-08-30 DIAGNOSIS — R42 Dizziness and giddiness: Secondary | ICD-10-CM | POA: Diagnosis not present

## 2017-08-30 DIAGNOSIS — I639 Cerebral infarction, unspecified: Secondary | ICD-10-CM | POA: Diagnosis not present

## 2017-08-30 DIAGNOSIS — E785 Hyperlipidemia, unspecified: Secondary | ICD-10-CM | POA: Diagnosis not present

## 2017-08-30 DIAGNOSIS — G459 Transient cerebral ischemic attack, unspecified: Principal | ICD-10-CM | POA: Diagnosis present

## 2017-08-30 DIAGNOSIS — Z87891 Personal history of nicotine dependence: Secondary | ICD-10-CM | POA: Insufficient documentation

## 2017-08-30 DIAGNOSIS — Z7984 Long term (current) use of oral hypoglycemic drugs: Secondary | ICD-10-CM | POA: Insufficient documentation

## 2017-08-30 DIAGNOSIS — E119 Type 2 diabetes mellitus without complications: Secondary | ICD-10-CM | POA: Insufficient documentation

## 2017-08-30 DIAGNOSIS — R29818 Other symptoms and signs involving the nervous system: Secondary | ICD-10-CM | POA: Diagnosis not present

## 2017-08-30 DIAGNOSIS — K219 Gastro-esophageal reflux disease without esophagitis: Secondary | ICD-10-CM | POA: Diagnosis not present

## 2017-08-30 DIAGNOSIS — R200 Anesthesia of skin: Secondary | ICD-10-CM | POA: Diagnosis not present

## 2017-08-30 DIAGNOSIS — I10 Essential (primary) hypertension: Secondary | ICD-10-CM | POA: Insufficient documentation

## 2017-08-30 DIAGNOSIS — R531 Weakness: Secondary | ICD-10-CM | POA: Diagnosis not present

## 2017-08-30 DIAGNOSIS — I700 Atherosclerosis of aorta: Secondary | ICD-10-CM | POA: Diagnosis not present

## 2017-08-30 DIAGNOSIS — M1990 Unspecified osteoarthritis, unspecified site: Secondary | ICD-10-CM | POA: Insufficient documentation

## 2017-08-30 LAB — CBC
HCT: 47.4 % (ref 40.0–52.0)
Hemoglobin: 16.3 g/dL (ref 13.0–18.0)
MCH: 30.2 pg (ref 26.0–34.0)
MCHC: 34.3 g/dL (ref 32.0–36.0)
MCV: 88.1 fL (ref 80.0–100.0)
PLATELETS: 149 10*3/uL — AB (ref 150–440)
RBC: 5.38 MIL/uL (ref 4.40–5.90)
RDW: 13.1 % (ref 11.5–14.5)
WBC: 6.8 10*3/uL (ref 3.8–10.6)

## 2017-08-30 LAB — TROPONIN I: Troponin I: <0.03 ng/mL (ref <0.03)

## 2017-08-30 LAB — DIFFERENTIAL
BASOS PCT: 1 %
Basophils Absolute: 0 10*3/uL (ref 0–0.1)
EOS ABS: 0.1 10*3/uL (ref 0–0.7)
EOS PCT: 2 %
Lymphocytes Relative: 22 %
Lymphs Abs: 1.5 10*3/uL (ref 1.0–3.6)
MONO ABS: 0.5 10*3/uL (ref 0.2–1.0)
Monocytes Relative: 8 %
NEUTROS PCT: 67 %
Neutro Abs: 4.7 10*3/uL (ref 1.4–6.5)

## 2017-08-30 LAB — COMPREHENSIVE METABOLIC PANEL
ALBUMIN: 4.3 g/dL (ref 3.5–5.0)
ALK PHOS: 72 U/L (ref 38–126)
ALT: 37 U/L (ref 17–63)
ANION GAP: 11 (ref 5–15)
AST: 32 U/L (ref 15–41)
BUN: 16 mg/dL (ref 6–20)
CALCIUM: 9.2 mg/dL (ref 8.9–10.3)
CO2: 25 mmol/L (ref 22–32)
CREATININE: 0.9 mg/dL (ref 0.61–1.24)
Chloride: 101 mmol/L (ref 101–111)
GFR calc non Af Amer: >60 mL/min (ref >60)
Glucose, Bld: 211 mg/dL — ABNORMAL HIGH (ref 65–99)
POTASSIUM: 4.3 mmol/L (ref 3.5–5.1)
SODIUM: 137 mmol/L (ref 135–145)
TOTAL PROTEIN: 7.4 g/dL (ref 6.5–8.1)
Total Bilirubin: 1 mg/dL (ref 0.3–1.2)

## 2017-08-30 LAB — PROTIME-INR
INR: 0.94
Prothrombin Time: 12.5 seconds (ref 11.4–15.2)

## 2017-08-30 LAB — GLUCOSE, CAPILLARY
GLUCOSE-CAPILLARY: 202 mg/dL — AB (ref 65–99)
GLUCOSE-CAPILLARY: 241 mg/dL — AB (ref 65–99)
Glucose-Capillary: 223 mg/dL — ABNORMAL HIGH (ref 65–99)

## 2017-08-30 LAB — APTT: aPTT: 27 seconds (ref 24–36)

## 2017-08-30 MED ORDER — ACETAMINOPHEN 325 MG PO TABS: 650.0000 mg | ORAL_TABLET | ORAL | Status: DC | PRN

## 2017-08-30 MED ORDER — SENNOSIDES-DOCUSATE SODIUM 8.6-50 MG PO TABS: 1.0000 | ORAL_TABLET | Freq: Every evening | ORAL | Status: DC | PRN

## 2017-08-30 MED ORDER — HYDROCHLOROTHIAZIDE 25 MG PO TABS
25.0000 mg | ORAL_TABLET | Freq: Every day | ORAL | Status: DC
  Administered 2017-08-30: 14:00:00 25 mg via ORAL
  Filled 2017-08-30: qty 1

## 2017-08-30 MED ORDER — ENOXAPARIN SODIUM 40 MG/0.4ML ~~LOC~~ SOLN
40.0000 mg | SUBCUTANEOUS | Status: DC
  Administered 2017-08-30: 21:00:00 40 mg via SUBCUTANEOUS
  Filled 2017-08-30: qty 0.4

## 2017-08-30 MED ORDER — INSULIN ASPART 100 UNIT/ML ~~LOC~~ SOLN
0.0000 [IU] | Freq: Three times a day (TID) | SUBCUTANEOUS | Status: DC
Start: 2017-08-30 — End: 2017-08-31
  Administered 2017-08-30 – 2017-08-31 (×2): 3 [IU] via SUBCUTANEOUS
  Administered 2017-08-31: 13:00:00 7 [IU] via SUBCUTANEOUS
  Filled 2017-08-30 (×3): qty 1

## 2017-08-30 MED ORDER — INSULIN ASPART 100 UNIT/ML ~~LOC~~ SOLN
0.0000 [IU] | Freq: Every day | SUBCUTANEOUS | Status: DC
  Administered 2017-08-30: 21:00:00 2 [IU] via SUBCUTANEOUS
  Filled 2017-08-30: qty 1

## 2017-08-30 MED ORDER — STROKE: EARLY STAGES OF RECOVERY BOOK
Freq: Once | Status: AC
  Administered 2017-08-30: 14:00:00

## 2017-08-30 MED ORDER — METFORMIN HCL 500 MG PO TABS
1000.0000 mg | ORAL_TABLET | Freq: Two times a day (BID) | ORAL | Status: DC
  Administered 2017-08-30 – 2017-08-31 (×2): 1000 mg via ORAL
  Filled 2017-08-30 (×3): qty 2

## 2017-08-30 MED ORDER — ALPRAZOLAM 0.5 MG PO TABS
0.5000 mg | ORAL_TABLET | Freq: Once | ORAL | Status: AC
  Administered 2017-08-30: 19:00:00 0.5 mg via ORAL
  Filled 2017-08-30: qty 1

## 2017-08-30 MED ORDER — ACETAMINOPHEN 160 MG/5ML PO SOLN
650.0000 mg | ORAL | Status: DC | PRN
  Filled 2017-08-30: qty 20.3

## 2017-08-30 MED ORDER — PANTOPRAZOLE SODIUM 40 MG PO TBEC
40.0000 mg | DELAYED_RELEASE_TABLET | Freq: Every day | ORAL | Status: DC
  Administered 2017-08-30 – 2017-08-31 (×2): 40 mg via ORAL
  Filled 2017-08-30 (×2): qty 1

## 2017-08-30 MED ORDER — METOPROLOL TARTRATE 25 MG PO TABS
25.0000 mg | ORAL_TABLET | Freq: Two times a day (BID) | ORAL | Status: DC
  Administered 2017-08-30 – 2017-08-31 (×3): 25 mg via ORAL
  Filled 2017-08-30 (×3): qty 1

## 2017-08-30 MED ORDER — ACETAMINOPHEN 650 MG RE SUPP: 650.0000 mg | RECTAL | Status: DC | PRN

## 2017-08-30 MED ORDER — OMEPRAZOLE MAGNESIUM 20 MG PO TBEC: 20.0000 mg | DELAYED_RELEASE_TABLET | Freq: Every day | ORAL | Status: DC

## 2017-08-30 MED ORDER — METOPROLOL TARTRATE 5 MG/5ML IV SOLN: 5.0000 mg | INTRAVENOUS | Status: DC | PRN

## 2017-08-30 MED ORDER — LABETALOL HCL 5 MG/ML IV SOLN
10.0000 mg | Freq: Once | INTRAVENOUS | Status: AC
  Administered 2017-08-30: 10 mg via INTRAVENOUS
  Filled 2017-08-30: qty 4

## 2017-08-30 MED ORDER — ASPIRIN EC 325 MG PO TBEC
325.0000 mg | DELAYED_RELEASE_TABLET | Freq: Every day | ORAL | Status: DC
  Administered 2017-08-30 – 2017-08-31 (×2): 325 mg via ORAL
  Filled 2017-08-30 (×2): qty 1

## 2017-08-30 NOTE — Evaluation (Signed)
Physical Therapy Evaluation and Discharge Patient Details Name: Richard LORENSON Sr. MRN: [id number] DOB: 24-Dec-1950 Today's Date: 08/30/2017   History of Present Illness  Pt is a 66 y/o M who presented with intermittent episodes of ongoing L facial and LUE numbness for 3 days PTA.  Pt reports a headache on day of admission and worsening of numbness.  CT head was negative and MRI is pending.  Neurology was not recommending TPA.  Pt's BP was initially 220/140.      Clinical Impression  Pt admitted with above presentation.  Of note, pt reports that in his past he has experienced similar symptoms in his face and arm with panic attacks but he has never experienced numbness in his fingers which he is currently experiencing.  Pt currently independent with all aspects of mobility with no signs of instability.  BUE and BLE srength and coordination WNL.  Pt denies any acute changes in vision but does report several years of intermittent episodes of blurry vision which he attributes to his diabetes, pt instructed to see an eye doctor.  Reviewed signs and symptoms and appropriate response to a stroke with pt and daughter, both verbalized understanding. No skilled PT needs identified, PT will sign off.     Follow Up Recommendations No PT follow up    Equipment Recommendations  None recommended by PT    Recommendations for Other Services       Precautions / Restrictions Precautions Precautions: None Restrictions Weight Bearing Restrictions: No      Mobility  Bed Mobility Overal bed mobility: Independent             General bed mobility comments: No assist or cues required.  Pt performs independently.   Transfers Overall transfer level: Independent Equipment used: None             General transfer comment: No assist or cues required.  Pt performs independently. No signs of instability.   Ambulation/Gait Ambulation/Gait assistance: Independent Ambulation Distance (Feet): 220  Feet Assistive device: None Gait Pattern/deviations: WFL(Within Functional Limits)   Gait velocity interpretation: at or above normal speed for age/gender General Gait Details: No gait abnormalities and no instability noted, even with high level balance activities tested with DGI.  Stairs Stairs: Yes Stairs assistance: Independent Stair Management: No rails;Alternating pattern;Forwards Number of Stairs: 6 General stair comments: Pt performs independently with no signs of instability.  Wheelchair Mobility    Modified Rankin (Stroke Patients Only) Modified Rankin (Stroke Patients Only) Pre-Morbid Rankin Score: No symptoms Modified Rankin: No significant disability     Balance Overall balance assessment: Independent                               Standardized Balance Assessment Standardized Balance Assessment : Dynamic Gait Index   Dynamic Gait Index Level Surface: Normal Change in Gait Speed: Normal Gait with Horizontal Head Turns: Normal Gait with Vertical Head Turns: Normal Gait and Pivot Turn: Normal Step Over Obstacle: Normal Step Around Obstacles: Normal Steps: Normal Total Score: 24       Pertinent Vitals/Pain Pain Assessment: No/denies pain    Home Living Family/patient expects to be discharged to:: Private residence Living Arrangements: Spouse/significant other Available Help at Discharge: Family;Available PRN/intermittently Type of Home: House Home Access: Stairs to enter Entrance Stairs-Rails: None Entrance Stairs-Number of Steps: 3 Home Layout: One level Home Equipment: Shower seat - built in      Prior Function Level  of Independence: Independent         Comments: Pt independent with all aspects of mobility and all ADLs.  He denies any falls in the past 6 months.  Pt works full time as a Dealer.      Hand Dominance   Dominant Hand: Right    Extremity/Trunk Assessment   Upper Extremity Assessment Upper Extremity  Assessment: LUE deficits/detail LUE Deficits / Details: Decreased sensation dorsal forearm, L side of face, and L fingers.  Strength and coordination WNL. LUE Sensation: decreased light touch    Lower Extremity Assessment Lower Extremity Assessment: Overall WFL for tasks assessed    Cervical / Trunk Assessment Cervical / Trunk Assessment: Normal  Communication   Communication: No difficulties  Cognition Arousal/Alertness: Awake/alert Behavior During Therapy: WFL for tasks assessed/performed Overall Cognitive Status: Within Functional Limits for tasks assessed                                        General Comments General comments (skin integrity, edema, etc.): Reviewed signs and symptoms and appropriate response to a stroke with pt and daughter, both verbalized understanding.   Of note, pt reports that in his past he has experienced similar symptoms in his face and arm with panic attacks but he has never experienced numbness in his fingers.    Exercises     Assessment/Plan    PT Assessment Patent does not need any further PT services  PT Problem List         PT Treatment Interventions      PT Goals (Current goals can be found in the Care Plan section)  Acute Rehab PT Goals PT Goal Formulation: All assessment and education complete, DC therapy    Frequency     Barriers to discharge        Co-evaluation               AM-PAC PT "6 Clicks" Daily Activity  Outcome Measure Difficulty turning over in bed (including adjusting bedclothes, sheets and blankets)?: None Difficulty moving from lying on back to sitting on the side of the bed? : None Difficulty sitting down on and standing up from a chair with arms (e.g., wheelchair, bedside commode, etc,.)?: None Help needed moving to and from a bed to chair (including a wheelchair)?: None Help needed walking in hospital room?: None Help needed climbing 3-5 steps with a railing? : None 6 Click Score:  24    End of Session Equipment Utilized During Treatment: Gait belt Activity Tolerance: Patient tolerated treatment well Patient left: in bed;with call bell/phone within reach;Other (comment) (sitting EOB) Nurse Communication: Mobility status PT Visit Diagnosis: Unsteadiness on feet (R26.81)    Time: 8182-9937 PT Time Calculation (min) (ACUTE ONLY): 24 min   Charges:   PT Evaluation $PT Eval Low Complexity: 1 Low     PT G Codes:   PT G-Codes **NOT FOR INPATIENT CLASS** Functional Assessment Tool Used: AM-PAC 6 Clicks Basic Mobility;Clinical judgement Functional Limitation: Mobility: Walking and moving around Mobility: Walking and Moving Around Current Status (J6967): 0 percent impaired, limited or restricted Mobility: Walking and Moving Around Goal Status (E9381): 0 percent impaired, limited or restricted Mobility: Walking and Moving Around Discharge Status (O1751): 0 percent impaired, limited or restricted    Collie Siad PT, DPT 08/30/2017, 3:14 PM

## 2017-08-30 NOTE — Consult Note (Signed)
Referring Physician: Dr. Kerman Passey    Chief Complaint:  L side numbness   HPI: Richard STOOKSBURY Sr. is an 66 y.o. male with hx of HTN, DM presented with L side numbness.  Pt was last known well at 8:30 AM. On exam pt has subjective numbness on L face and LUE.  NIHSS of 1.  No focal motor deficits. Pt had similar symptoms on Monday lasting one hour that have self resolved.  No anti platelet therapy at home.  CTH no acute abnormalities   Date last known well: Date: 08/30/2017 Time last known well: Time: 08:30 tPA Given: No: NIHSS of 1   Past Medical History:  Diagnosis Date  . Arthritis   . Diabetes mellitus without complication ([place])   . GERD (gastroesophageal reflux disease)   . Hemorrhoid     Past Surgical History:  Procedure Laterality Date  . ANAL FISTULOTOMY N/A 04/30/2015   Procedure: ANAL FISTULOTOMY;  Surgeon: Christene Lye, MD;  Location: [facility] ORS;  Service: General;  Laterality: N/A;  . COLONOSCOPY N/A 04/30/2015   Procedure: COLONOSCOPY;  Surgeon: Christene Lye, MD;  Location: [facility] ORS;  Service: General;  Laterality: N/A;  . TONSILLECTOMY      Family History  Problem Relation Age of Onset  . Lung cancer Father   . Ovarian cancer Mother   . Colon polyps Father    Social History:  reports that he quit smoking about 11 years ago. He has never used smokeless tobacco. He reports that he drinks alcohol. He reports that he does not use drugs.  Allergies: No Known Allergies  Medications: I have reviewed the patient's current medications.  ROS: History obtained from the patient  General ROS: negative for - chills, fatigue, fever, night sweats, weight gain or weight loss Psychological ROS: negative for - behavioral disorder, hallucinations, memory difficulties, mood swings or suicidal ideation Ophthalmic ROS: negative for - blurry vision, double vision, eye pain or loss of vision ENT ROS: negative for - epistaxis, nasal discharge, oral lesions, sore throat,  tinnitus or vertigo Allergy and Immunology ROS: negative for - hives or itchy/watery eyes Hematological and Lymphatic ROS: negative for - bleeding problems, bruising or swollen lymph nodes Endocrine ROS: negative for - galactorrhea, hair pattern changes, polydipsia/polyuria or temperature intolerance Respiratory ROS: negative for - cough, hemoptysis, shortness of breath or wheezing Cardiovascular ROS: negative for - chest pain, dyspnea on exertion, edema or irregular heartbeat Gastrointestinal ROS: negative for - abdominal pain, diarrhea, hematemesis, nausea/vomiting or stool incontinence Genito-Urinary ROS: negative for - dysuria, hematuria, incontinence or urinary frequency/urgency Musculoskeletal ROS: negative for - joint swelling or muscular weakness Neurological ROS: as noted in HPI Dermatological ROS: negative for rash and skin lesion changes  Physical Examination: Blood pressure (!) 185/89, pulse 69, temperature 98 F (36.7 C), resp. rate (!) 22, height 5\' 10"  (1.778 m), weight 87.1 kg (192 lb), SpO2 97 %.   Neurological Examination   Mental Status: Alert, oriented, thought content appropriate.  Speech fluent without evidence of aphasia.  Able to follow 3 step commands without difficulty. Cranial Nerves: II: Discs flat bilaterally; Visual fields grossly normal, pupils equal, round, reactive to light and accommodation III,IV, VI: ptosis not present, extra-ocular motions intact bilaterally V,VII: smile symmetric, facial light touch sensation normal bilaterally VIII: hearing normal bilaterally IX,X: gag reflex present XI: bilateral shoulder shrug XII: midline tongue extension Motor: Right : Upper extremity   5/5    Left:     Upper extremity   5/5  Lower extremity   5/5     Lower extremity   5/5 Tone and bulk:normal tone throughout; no atrophy noted Sensory: L side numbness  Deep Tendon Reflexes: 1+ and symmetric throughout Plantars: Right: downgoing   Left:  downgoing Cerebellar: normal finger-to-nose, normal rapid alternating movements and normal heel-to-shin test Gait: not tested      Laboratory Studies:  Basic Metabolic Panel:  Recent Labs Lab 08/30/17 1041  NA 137  K 4.3  CL 101  CO2 25  GLUCOSE 211*  BUN 16  CREATININE 0.90  CALCIUM 9.2    Liver Function Tests:  Recent Labs Lab 08/30/17 1041  AST 32  ALT 37  ALKPHOS 72  BILITOT 1.0  PROT 7.4  ALBUMIN 4.3   No results for input(s): LIPASE, AMYLASE in the last 168 hours. No results for input(s): AMMONIA in the last 168 hours.  CBC:  Recent Labs Lab 08/30/17 1041  WBC 6.8  NEUTROABS 4.7  HGB 16.3  HCT 47.4  MCV 88.1  PLT 149*    Cardiac Enzymes:  Recent Labs Lab 08/30/17 1041  TROPONINI <0.03    BNP: Invalid input(s): POCBNP  CBG:  Recent Labs Lab 08/30/17 [place]    Microbiology: No results found for this or any previous visit.  Coagulation Studies:  Recent Labs  08/30/17 1041  LABPROT 12.5  INR 0.94    Urinalysis: No results for input(s): COLORURINE, LABSPEC, PHURINE, GLUCOSEU, HGBUR, BILIRUBINUR, KETONESUR, PROTEINUR, UROBILINOGEN, NITRITE, LEUKOCYTESUR in the last 168 hours.  Invalid input(s): APPERANCEUR  Lipid Panel: No results found for: CHOL, TRIG, HDL, CHOLHDL, VLDL, LDLCALC  HgbA1C: No results found for: HGBA1C  Urine Drug Screen:  No results found for: LABOPIA, COCAINSCRNUR, LABBENZ, AMPHETMU, THCU, LABBARB  Alcohol Level: No results for input(s): ETH in the last 168 hours.  Other results: EKG: normal EKG, normal sinus rhythm, unchanged from previous tracings.  Imaging: Ct Head Code Stroke Wo Contrast  Result Date: 08/30/2017 CLINICAL DATA:  Code stroke. Focal neuro deficit less than 6 hours. Left-sided numbness EXAM: CT HEAD WITHOUT CONTRAST TECHNIQUE: Contiguous axial images were obtained from the base of the skull through the vertex without intravenous contrast. COMPARISON:  None. FINDINGS:  Brain: Mild hypodensity in the cerebral white matter bilaterally is symmetric. No acute infarct, hemorrhage, mass. Ventricle size normal. Vascular: Negative for hyperdense vessel. Skull: Negative Sinuses/Orbits: Mild mucosal edema in the ethmoid sinuses. 7 mm osteoma left ethmoid sinus. Normal orbit. Other: None ASPECTS ([place] Stroke Program Early CT Score) - Ganglionic level infarction (caudate, lentiform nuclei, internal capsule, insula, M1-M3 cortex): 7 - Supraganglionic infarction (M4-M6 cortex): 3 Total score (0-10 with 10 being normal): 10 IMPRESSION: 1. No acute abnormality 2. ASPECTS is 10 These results were called by telephone at the time of interpretation on 08/30/2017 at 10:50 am to Dr. Harvest Dark , who verbally acknowledged these results. Electronically Signed   By: Franchot Gallo M.D.   On: 08/30/2017 10:51    Assessment: 66 y.o. male  male with hx of HTN, DM presented with L side numbness.  Pt was last known well at 8:30 AM. On exam pt has subjective numbness on L face and LUE.  NIHSS of 1.  No focal motor deficits. Pt had similar symptoms on Monday lasting one hour that have self resolved.  No anti platelet therapy at home.  CTH no acute abnormalities    Stroke Risk Factors - hyperlipidemia, hypertension and smoking   Not TPA candidate as low NIHSS of 1.  Anti platelet therapy and statin.   Plan: 1. HgbA1c, fasting lipid panel 2. MRI, MRA  of the brain without contrast 3. PT consult, OT consult, Speech consult 4. Echocardiogram 5. Carotid dopplers 6. Prophylactic therapy-Antiplatelet med: Aspirin - dose 325 7. NPO until RN stroke swallow screen 8. Telemetry monitoring 9. Frequent neuro checks   08/30/2017, 11:23 AM

## 2017-08-30 NOTE — ED Provider Notes (Signed)
[facility] Emergency Department Provider Note  Time seen: 11:30 AM  I have reviewed the triage vital signs and the nursing notes.   HISTORY  Chief Complaint Numbness    HPI Richard MONKS Sr. is a 66 y.o. male with a past medical history of diabetes, gastric reflux, presents the emergency department with left facial and left arm numbness.  Patient states initially the symptoms started on Monday 3 days ago, but went away. Today the patient had a headache and stated the symptoms recurred and were much more severe. Felt his entire left sidewent numb as well as left arm. Denies any confusion or slurred speech. Denies any lower extremity weakness. Patient states he has had somewhat similar symptoms in the past with panic attacks but did not feel acute expanding a panic attack today. Patient arrived to the emergency department and a code stroke was activated based on the symptoms starting/worsening this morning.  Past Medical History:  Diagnosis Date  . Arthritis   . Diabetes mellitus without complication ([place])   . GERD (gastroesophageal reflux disease)   . Hemorrhoid     There are no active problems to display for this patient.   Past Surgical History:  Procedure Laterality Date  . ANAL FISTULOTOMY N/A 04/30/2015   Procedure: ANAL FISTULOTOMY;  Surgeon: Christene Lye, MD;  Location: [facility] ORS;  Service: General;  Laterality: N/A;  . COLONOSCOPY N/A 04/30/2015   Procedure: COLONOSCOPY;  Surgeon: Christene Lye, MD;  Location: [facility] ORS;  Service: General;  Laterality: N/A;  . TONSILLECTOMY      Prior to Admission medications   Medication Sig Start Date End Date Taking? Authorizing Provider  metFORMIN (GLUCOPHAGE) 500 MG tablet Take 1,000 mg by mouth 2 (two) times daily with a meal.    Yes [provider]  omeprazole (PRILOSEC OTC) 20 MG tablet Take 20 mg by mouth daily.   Yes [provider]  doxycycline (VIBRAMYCIN) 100 MG capsule  Take 1 capsule (100 mg total) by mouth 2 (two) times daily. Patient not taking: Reported on 08/30/2017 07/01/15   Christene Lye, MD    No Known Allergies  Family History  Problem Relation Age of Onset  . Lung cancer Father   . Ovarian cancer Mother   . Colon polyps Father     Social History Social History  Substance Use Topics  . Smoking status: Former Smoker    Quit date: 12/18/2005  . Smokeless tobacco: Never Used  . Alcohol use 0.0 oz/week     Comment: occasionally    Review of Systems Constitutional: Negative for fever. Cardiovascular: Negative for chest pain. Respiratory: Negative for shortness of breath. Gastrointestinal: Negative for abdominal pain Musculoskeletal: Mild neck pain times months Neurological: Mild headache. Numbness of the left arm and left face. Denies weakness. All other ROS negative  ____________________________________________   PHYSICAL EXAM:  VITAL SIGNS: ED Triage Vitals  Enc Vitals Group     BP 08/30/17 1024 (!) 203/87     Pulse Rate 08/30/17 1025 79     Resp 08/30/17 1025 18     Temp 08/30/17 1025 98 F (36.7 C)     Temp Source 08/30/17 1025 Oral     SpO2 08/30/17 1025 95 %     Weight 08/30/17 1025 192 lb (87.1 kg)     Height 08/30/17 1025 5\' 10"  (1.778 m)     Head Circumference --      Peak Flow --  Pain Score --      Pain Loc --      Pain Edu? --      Excl. in [place]? --    Constitutional: Alert and oriented. Well appearing and in no distress. Eyes: Normal exam ENT   Head: Normocephalic and atraumatic.   Mouth/Throat: Mucous membranes are moist. Cardiovascular: Normal rate, regular rhythm. No murmur Respiratory: Normal respiratory effort without tachypnea nor retractions. Breath sounds are clear  Gastrointestinal: Soft and nontender. No distention.   Musculoskeletal: Nontender with normal range of motion in all extremities Neurologic:  Normal speech and language. No gross deficits on exam. Patient has equal  grip strengths, normal sensation in face upper and lower extremities. Denies any sensory deficits. 5/5 motor in all extremities. No upper or lower drift. Cranial nerves intact Skin:  Skin is warm, dry and intact.  Psychiatric: Mood and affect are normal.   ____________________________________________    EKG  EKG reviewed and interpreted by myself shows normal sinus rhythm at 77 bpm, narrow QRS, normal axis, normal intervals, no concerning ST changes.  ____________________________________________    RADIOLOGY  CT negative  ____________________________________________   INITIAL IMPRESSION / ASSESSMENT AND PLAN / ED COURSE  Pertinent labs & imaging results that were available during my care of the patient were reviewed by me and considered in my medical decision making (see chart for details).  Patient present to the emergency department as a code stroke with left face and left arm numbness starting this morning although the patient states it has been intermittent since Monday. Patient has been seen by neurology, current with an NIH stroke scale of 0 on my exam. Neurology recommends admission for CVA workup. Not a candidate for TPA at this time given improving/resolved symptoms. Patient agreeable to plan.  ____________________________________________   FINAL CLINICAL IMPRESSION(S) / ED DIAGNOSES  TIA Facial numbness    Harvest Dark, MD 08/30/17 1142

## 2017-08-30 NOTE — H&P (Signed)
[place] [facility] [place] NAME: Richard Thornton    MR#:  [id number]  DATE OF BIRTH:  Nov 24, 1951  DATE OF ADMISSION:  08/30/2017  PRIMARY CARE PHYSICIAN: Nathaneil Canary, PA-C   REQUESTING/REFERRING PHYSICIAN: Harvest Dark, MD  CHIEF COMPLAINT:   Left facial and left hand numbness HISTORY OF PRESENT ILLNESS:  Richard Thornton  is a 66 y.o. male with a known history of diabetes me this, GERD is presenting to the ED with a chief complaint of intermittent episodes of ongoing left facial and left upper extremity numbness for 3 days starting on last Monday. Symptoms are resolving on its own but today patient started having headache associated with worsening of left facial and left upper extremity numbness with weakness which prompted him to come to the hospital. CT head is negative. Patient was seen by neurology who did not recommend TPA as patient has ongoing intermittent episodes of symptoms for the past 3 days.during my examination patient is reporting that his left-sided weakness and numbness are improving.  PAST MEDICAL HISTORY:   Past Medical History:  Diagnosis Date  . Arthritis   . Diabetes mellitus without complication ([place])   . GERD (gastroesophageal reflux disease)   . Hemorrhoid     PAST SURGICAL HISTOIRY:   Past Surgical History:  Procedure Laterality Date  . ANAL FISTULOTOMY N/A 04/30/2015   Procedure: ANAL FISTULOTOMY;  Surgeon: Christene Lye, MD;  Location: [facility] ORS;  Service: General;  Laterality: N/A;  . COLONOSCOPY N/A 04/30/2015   Procedure: COLONOSCOPY;  Surgeon: Christene Lye, MD;  Location: [facility] ORS;  Service: General;  Laterality: N/A;  . TONSILLECTOMY      SOCIAL HISTORY:   Social History  Substance Use Topics  . Smoking status: Former Smoker    Quit date: 12/18/2005  . Smokeless tobacco: Never Used  . Alcohol use 0.0 oz/week     Comment: occasionally    FAMILY HISTORY:   Family History   Problem Relation Age of Onset  . Lung cancer Father   . Ovarian cancer Mother   . Colon polyps Father     DRUG ALLERGIES:  No Known Allergies  REVIEW OF SYSTEMS:  CONSTITUTIONAL: No fever, fatigue or weakness.  EYES: No blurred or double vision.  EARS, NOSE, AND THROAT: No tinnitus or ear pain. Reporting headache RESPIRATORY: No cough, shortness of breath, wheezing or hemoptysis.  CARDIOVASCULAR: No chest pain, orthopnea, edema.  GASTROINTESTINAL: No nausea, vomiting, diarrhea or abdominal pain.  GENITOURINARY: No dysuria, hematuria.  ENDOCRINE: No polyuria, nocturia,  HEMATOLOGY: No anemia, easy bruising or bleeding SKIN: No rash or lesion. MUSCULOSKELETAL: No joint pain or arthritis.   NEUROLOGIC: reporting left facial and hand numbness and weakness  PSYCHIATRY: No anxiety or depression.   MEDICATIONS AT HOME:   Prior to Admission medications   Medication Sig Start Date End Date Taking? Authorizing Provider  metFORMIN (GLUCOPHAGE) 500 MG tablet Take 1,000 mg by mouth 2 (two) times daily with a meal.    Yes [provider]  omeprazole (PRILOSEC OTC) 20 MG tablet Take 20 mg by mouth daily.   Yes [provider]  doxycycline (VIBRAMYCIN) 100 MG capsule Take 1 capsule (100 mg total) by mouth 2 (two) times daily. Patient not taking: Reported on 08/30/2017 07/01/15   Christene Lye, MD      VITAL SIGNS:  Blood pressure (!) 165/87, pulse 61, temperature 98 F (36.7 C), resp. rate 18, height 5\' 10"  (1.778 m),  weight 87.1 kg (192 lb), SpO2 98 %.  PHYSICAL EXAMINATION:  GENERAL:  66 y.o.-year-old patient lying in the bed with no acute distress.  EYES: Pupils equal, round, reactive to light and accommodation. No scleral icterus. Extraocular muscles intact.  HEENT: Head atraumatic, normocephalic. Oropharynx and nasopharynx clear.  NECK:  Supple, no jugular venous distention. No thyroid enlargement, no tenderness.  LUNGS: Normal breath sounds bilaterally, no  wheezing, rales,rhonchi or crepitation. No use of accessory muscles of respiration.  CARDIOVASCULAR: S1, S2 normal. No murmurs, rubs, or gallops.  ABDOMEN: Soft, nontender, nondistended. Bowel sounds present. No organomegaly or mass.  EXTREMITIES: No pedal edema, cyanosis, or clubbing.  NEUROLOGIC: Cranial nerves II through XII are intact. Muscle strength 5/5 in all extremities. Sensation intact. Gait not checked.  PSYCHIATRIC: The patient is alert and oriented x 3.  SKIN: No obvious rash, lesion, or ulcer.   LABORATORY PANEL:   CBC  Recent Labs Lab 08/30/17 1041  WBC 6.8  HGB 16.3  HCT 47.4  PLT 149*   ------------------------------------------------------------------------------------------------------------------  Chemistries   Recent Labs Lab 08/30/17 1041  NA 137  K 4.3  CL 101  CO2 25  GLUCOSE 211*  BUN 16  CREATININE 0.90  CALCIUM 9.2  AST 32  ALT 37  ALKPHOS 72  BILITOT 1.0   ------------------------------------------------------------------------------------------------------------------  Cardiac Enzymes  Recent Labs Lab 08/30/17 1041  TROPONINI <0.03   ------------------------------------------------------------------------------------------------------------------  RADIOLOGY:  Ct Head Code Stroke Wo Contrast  Result Date: 08/30/2017 CLINICAL DATA:  Code stroke. Focal neuro deficit less than 6 hours. Left-sided numbness EXAM: CT HEAD WITHOUT CONTRAST TECHNIQUE: Contiguous axial images were obtained from the base of the skull through the vertex without intravenous contrast. COMPARISON:  None. FINDINGS: Brain: Mild hypodensity in the cerebral white matter bilaterally is symmetric. No acute infarct, hemorrhage, mass. Ventricle size normal. Vascular: Negative for hyperdense vessel. Skull: Negative Sinuses/Orbits: Mild mucosal edema in the ethmoid sinuses. 7 mm osteoma left ethmoid sinus. Normal orbit. Other: None ASPECTS ([place] Stroke Program Early CT  Score) - Ganglionic level infarction (caudate, lentiform nuclei, internal capsule, insula, M1-M3 cortex): 7 - Supraganglionic infarction (M4-M6 cortex): 3 Total score (0-10 with 10 being normal): 10 IMPRESSION: 1. No acute abnormality 2. ASPECTS is 10 These results were called by telephone at the time of interpretation on 08/30/2017 at 10:50 am to Dr. Harvest Dark , who verbally acknowledged these results. Electronically Signed   By: Franchot Gallo M.D.   On: 08/30/2017 10:51    EKG:   Orders placed or performed during the hospital encounter of 08/30/17  . ED EKG  . ED EKG    IMPRESSION AND PLAN:   Richard Thornton  is a 66 y.o. male with a known history of diabetes me this, GERD is presenting to the ED with a chief complaint of intermittent episodes of ongoing left facial and left upper extremity numbness for 3 days starting on last Monday. Symptoms are resolving on its own but today patient started having headache associated with worsening of left facial and left upper extremity numbness with weakness which prompted him to come to the hospital. CT head is negative.   # TIA  Admit patient to MedSurg unit under observation status CT head is negative for acute stroke Will get complete stroke workup with the MRI brain, carotid Dopplers and 2-D echocardiogram Neurology consult is placed Will get neuro checks  patient has passed bedside swallow evaluation Aspirin enteric coated 320 mg by mouth once daily Check TSH, hemoglobin A1c and  fasting lipid panel PT, OT evaluation Tight control of his blood pressure is required  #Malignant hypertension-which could be the etiology of TIA Patient's blood pressure was 220/140 initially, Not on any blood pressure medications IV Lopressor as needed Patient is started on hydrochlorothiazide and metoprolol titrate as needed  #Headache secondary to malignant hypertension Tylenol as needed We need better control of the blood pressure  #Diabetes  mellitus Sliding scale insulin, continue metformin, check hemoglobin A1c  #GERD Protonix  DVT prophylaxis with Lovenox subcutaneous  All the records are reviewed and case discussed with ED provider. Management plans discussed with the patient, family and they are in agreement.  CODE STATUS: fc, son is HCPOA  TOTAL TIME TAKING CARE OF THIS PATIENT: 45  minutes.   Note: This dictation was prepared with Dragon dictation along with smaller phrase technology. Any transcriptional errors that result from this process are unintentional.  Nicholes Mango M.D on 08/30/2017 at 12:21 PM  Between 7am to 6pm - Pager - [phone]  After 6pm go to [email or web address] - password [email or web address] [facility]  Office  [phone]  CC: Primary care physician; Nathaneil Canary, PA-C

## 2017-08-30 NOTE — Progress Notes (Signed)
OT Screen Note  Patient Details Name: Richard TROMPETER Sr. MRN: [id number] DOB: 13-Aug-1951   Cancelled Treatment:    Reason Eval/Treat Not Completed: OT screened, no needs identified, will sign off. On initial attempt, pt eating lunch and politely requesting to finish. On 2nd attempt, spoke with PT. Upon assessment, PT indicated no skilled therapy needs and pt confirmed no PT and OT needs at this time. Pt with no strength or coordination deficits. Will sign off. Please re-consult if additional OT needs arise.   Jeni Salles, MPH, MS, OTR/L ascom [phone] 08/30/17, 3:31 PM

## 2017-08-31 ENCOUNTER — Observation Stay: Payer: Medicare Other

## 2017-08-31 DIAGNOSIS — G459 Transient cerebral ischemic attack, unspecified: Secondary | ICD-10-CM | POA: Diagnosis not present

## 2017-08-31 DIAGNOSIS — I10 Essential (primary) hypertension: Secondary | ICD-10-CM | POA: Diagnosis not present

## 2017-08-31 DIAGNOSIS — K219 Gastro-esophageal reflux disease without esophagitis: Secondary | ICD-10-CM | POA: Diagnosis not present

## 2017-08-31 DIAGNOSIS — I639 Cerebral infarction, unspecified: Secondary | ICD-10-CM | POA: Diagnosis not present

## 2017-08-31 DIAGNOSIS — E119 Type 2 diabetes mellitus without complications: Secondary | ICD-10-CM | POA: Diagnosis not present

## 2017-08-31 LAB — BASIC METABOLIC PANEL
ANION GAP: 10 (ref 5–15)
BUN: 16 mg/dL (ref 6–20)
CALCIUM: 9.6 mg/dL (ref 8.9–10.3)
CO2: 26 mmol/L (ref 22–32)
Chloride: 99 mmol/L — ABNORMAL LOW (ref 101–111)
Creatinine, Ser: 0.79 mg/dL (ref 0.61–1.24)
GFR calc Af Amer: >60 mL/min (ref >60)
Glucose, Bld: 218 mg/dL — ABNORMAL HIGH (ref 65–99)
POTASSIUM: 4.1 mmol/L (ref 3.5–5.1)
SODIUM: 135 mmol/L (ref 135–145)

## 2017-08-31 LAB — LIPID PANEL
Cholesterol: 236 mg/dL — ABNORMAL HIGH (ref 0–200)
HDL: 33 mg/dL — ABNORMAL LOW (ref >40)
LDL Cholesterol: UNDETERMINED mg/dL (ref 0–99)
Total CHOL/HDL Ratio: 7.2 RATIO
Triglycerides: 411 mg/dL — ABNORMAL HIGH (ref <150)
VLDL: UNDETERMINED mg/dL (ref 0–40)

## 2017-08-31 LAB — HEMOGLOBIN A1C
HEMOGLOBIN A1C: 9.3 % — AB (ref 4.8–5.6)
Mean Plasma Glucose: 220.21 mg/dL

## 2017-08-31 LAB — ECHOCARDIOGRAM COMPLETE
HEIGHTINCHES: 70 in
Weight: 3072 oz

## 2017-08-31 LAB — GLUCOSE, CAPILLARY
GLUCOSE-CAPILLARY: 205 mg/dL — AB (ref 65–99)
Glucose-Capillary: 335 mg/dL — ABNORMAL HIGH (ref 65–99)

## 2017-08-31 MED ORDER — AMLODIPINE BESYLATE 5 MG PO TABS
5.0000 mg | ORAL_TABLET | Freq: Every day | ORAL | Status: DC
  Administered 2017-08-31: 5 mg via ORAL
  Filled 2017-08-31: qty 1

## 2017-08-31 MED ORDER — ASPIRIN EC 81 MG PO TBEC: 81.0000 mg | DELAYED_RELEASE_TABLET | Freq: Every day | ORAL | 2 refills | Status: AC

## 2017-08-31 MED ORDER — METOPROLOL TARTRATE 25 MG PO TABS: 25.0000 mg | ORAL_TABLET | Freq: Two times a day (BID) | ORAL | 2 refills | Status: DC

## 2017-08-31 MED ORDER — IOPAMIDOL (ISOVUE-370) INJECTION 76%
75.0000 mL | Freq: Once | INTRAVENOUS | Status: AC | PRN
  Administered 2017-08-31: 10:00:00 75 mL via INTRAVENOUS

## 2017-08-31 MED ORDER — AMLODIPINE BESYLATE 5 MG PO TABS: 5.0000 mg | ORAL_TABLET | Freq: Every day | ORAL | 2 refills | Status: DC

## 2017-08-31 MED ORDER — CLOPIDOGREL BISULFATE 75 MG PO TABS: 75.0000 mg | ORAL_TABLET | Freq: Every day | ORAL | 2 refills | Status: AC

## 2017-08-31 MED ORDER — ENSURE ENLIVE PO LIQD
237.0000 mL | Freq: Two times a day (BID) | ORAL | Status: DC
  Administered 2017-08-31: 11:00:00 237 mL via ORAL

## 2017-08-31 MED ORDER — ATORVASTATIN CALCIUM 20 MG PO TABS: 40.0000 mg | ORAL_TABLET | Freq: Every day | ORAL | Status: DC

## 2017-08-31 MED ORDER — ATORVASTATIN CALCIUM 40 MG PO TABS: 40.0000 mg | ORAL_TABLET | Freq: Every day | ORAL | 2 refills | Status: DC

## 2017-08-31 MED ORDER — GLIPIZIDE ER 2.5 MG PO TB24: 2.5000 mg | ORAL_TABLET | Freq: Every day | ORAL | 2 refills | Status: DC

## 2017-08-31 NOTE — Progress Notes (Signed)
Inpatient Diabetes Program Recommendations  AACE/ADA: New Consensus Statement on Inpatient Glycemic Control (2015)  Target Ranges:  Prepandial:   less than 140 mg/dL      Peak postprandial:   less than 180 mg/dL (1-2 hours)      Critically ill patients:  140 - 180 mg/dL   Results for Richard Thornton, Richard SR. (MRN [id number]) as of 08/31/2017 08:46  Ref. Range 08/30/2017 10:46 08/30/2017 16:58 08/30/2017 20:25 08/31/2017 08:01  Glucose-Capillary Latest Ref Range: 65 - 99 mg/dL 202 (H) 223 (H) 241 (H) 205 (H)   Review of Glycemic Control  Diabetes history: DM2 Outpatient Diabetes medications: Metformin 1000 mg BID Current orders for Inpatient glycemic control: Metformin 1000 mg BID, Novolog 0-9 units TID with meals, Novolog 0-5 units QHS  Inpatient Diabetes Program Recommendations: Insulin - Basal: While inpatient, please consider ordering Lantus 8 units daily. HgbA1C: A1C in process. Initial glucose 202 mg/dl and glucose has remained in the 200's mg/dl since admitted. If A1C is elevated, patient will need additional outpatient DM medications and follow up with PCP regarding DM control.  Thanks, Barnie Alderman, RN, MSN, CDE Diabetes Coordinator Inpatient Diabetes Program [phone] (Team Pager from 8am to 5pm)

## 2017-08-31 NOTE — Consult Note (Signed)
Referring Physician: Dr. Kerman Passey    Chief Complaint:  L side numbness   No change in examination. Still has L facial numbness. R thalamic infract.   Date last known well: Date: 08/30/2017 Time last known well: Time: 08:30 tPA Given: No: NIHSS of 1   Past Medical History:  Diagnosis Date  . Arthritis   . Diabetes mellitus without complication ([place])   . GERD (gastroesophageal reflux disease)   . Hemorrhoid     Past Surgical History:  Procedure Laterality Date  . ANAL FISTULOTOMY N/A 04/30/2015   Procedure: ANAL FISTULOTOMY;  Surgeon: Christene Lye, MD;  Location: [facility] ORS;  Service: General;  Laterality: N/A;  . COLONOSCOPY N/A 04/30/2015   Procedure: COLONOSCOPY;  Surgeon: Christene Lye, MD;  Location: [facility] ORS;  Service: General;  Laterality: N/A;  . TONSILLECTOMY      Family History  Problem Relation Age of Onset  . Lung cancer Father   . Colon polyps Father   . Ovarian cancer Mother    Social History:  reports that he quit smoking about 11 years ago. He has never used smokeless tobacco. He reports that he drinks about 1.2 oz of alcohol per week . He reports that he does not use drugs.  Allergies: No Known Allergies  Medications: I have reviewed the patient's current medications.  ROS: History obtained from the patient  General ROS: negative for - chills, fatigue, fever, night sweats, weight gain or weight loss Psychological ROS: negative for - behavioral disorder, hallucinations, memory difficulties, mood swings or suicidal ideation Ophthalmic ROS: negative for - blurry vision, double vision, eye pain or loss of vision ENT ROS: negative for - epistaxis, nasal discharge, oral lesions, sore throat, tinnitus or vertigo Allergy and Immunology ROS: negative for - hives or itchy/watery eyes Hematological and Lymphatic ROS: negative for - bleeding problems, bruising or swollen lymph nodes Endocrine ROS: negative for - galactorrhea, hair pattern changes,  polydipsia/polyuria or temperature intolerance Respiratory ROS: negative for - cough, hemoptysis, shortness of breath or wheezing Cardiovascular ROS: negative for - chest pain, dyspnea on exertion, edema or irregular heartbeat Gastrointestinal ROS: negative for - abdominal pain, diarrhea, hematemesis, nausea/vomiting or stool incontinence Genito-Urinary ROS: negative for - dysuria, hematuria, incontinence or urinary frequency/urgency Musculoskeletal ROS: negative for - joint swelling or muscular weakness Neurological ROS: as noted in HPI Dermatological ROS: negative for rash and skin lesion changes  Physical Examination: Blood pressure (!) 154/80, pulse 60, temperature 98 F (36.7 C), resp. rate 16, height 5\' 10"  (1.778 m), weight 87.1 kg (192 lb), SpO2 96 %.   Neurological Examination   Mental Status: Alert, oriented, thought content appropriate.  Speech fluent without evidence of aphasia.  Able to follow 3 step commands without difficulty. Cranial Nerves: II: Discs flat bilaterally; Visual fields grossly normal, pupils equal, round, reactive to light and accommodation III,IV, VI: ptosis not present, extra-ocular motions intact bilaterally V,VII: smile symmetric, facial light touch sensation normal bilaterally VIII: hearing normal bilaterally IX,X: gag reflex present XI: bilateral shoulder shrug XII: midline tongue extension Motor: Right : Upper extremity   5/5    Left:     Upper extremity   5/5  Lower extremity   5/5     Lower extremity   5/5 Tone and bulk:normal tone throughout; no atrophy noted Sensory: L side numbness  Deep Tendon Reflexes: 1+ and symmetric throughout Plantars: Right: downgoing   Left: downgoing Cerebellar: normal finger-to-nose, normal rapid alternating movements and normal heel-to-shin test Gait: not tested  Laboratory Studies:  Basic Metabolic Panel:  Recent Labs Lab 08/30/17 1041 08/31/17 0517  NA 137 135  K 4.3 4.1  CL 101 99*  CO2 25  26  GLUCOSE 211* 218*  BUN 16 16  CREATININE 0.90 0.79  CALCIUM 9.2 9.6    Liver Function Tests:  Recent Labs Lab 08/30/17 1041  AST 32  ALT 37  ALKPHOS 72  BILITOT 1.0  PROT 7.4  ALBUMIN 4.3   No results for input(s): LIPASE, AMYLASE in the last 168 hours. No results for input(s): AMMONIA in the last 168 hours.  CBC:  Recent Labs Lab 08/30/17 1041  WBC 6.8  NEUTROABS 4.7  HGB 16.3  HCT 47.4  MCV 88.1  PLT 149*    Cardiac Enzymes:  Recent Labs Lab 08/30/17 1041  TROPONINI <0.03    BNP: Invalid input(s): POCBNP  CBG:  Recent Labs Lab 08/30/17 1046 08/30/17 1658 08/30/17 2025 08/31/17 0801  GLUCAP 202* 223* 241* 205*    Microbiology: No results found for this or any previous visit.  Coagulation Studies:  Recent Labs  08/30/17 1041  LABPROT 12.5  INR 0.94    Urinalysis: No results for input(s): COLORURINE, LABSPEC, PHURINE, GLUCOSEU, HGBUR, BILIRUBINUR, KETONESUR, PROTEINUR, UROBILINOGEN, NITRITE, LEUKOCYTESUR in the last 168 hours.  Invalid input(s): APPERANCEUR  Lipid Panel:    Component Value Date/Time   CHOL 236 (H) 08/31/2017 0517   TRIG 411 (H) 08/31/2017 0517   HDL 33 (L) 08/31/2017 0517   CHOLHDL 7.2 08/31/2017 0517   VLDL UNABLE TO CALCULATE IF TRIGLYCERIDE OVER 400 mg/dL 08/31/2017 0517   LDLCALC UNABLE TO CALCULATE IF TRIGLYCERIDE OVER 400 mg/dL 08/31/2017 0517    HgbA1C: No results found for: HGBA1C  Urine Drug Screen:  No results found for: LABOPIA, COCAINSCRNUR, LABBENZ, AMPHETMU, THCU, LABBARB  Alcohol Level: No results for input(s): ETH in the last 168 hours.  Other results: EKG: normal EKG, normal sinus rhythm, unchanged from previous tracings.  Imaging: Mr Brain Wo Contrast  Result Date: 08/30/2017 CLINICAL DATA:  Initial evaluation for intermittent left-sided facial and upper extremity numbness. EXAM: MRI HEAD WITHOUT CONTRAST MRA HEAD WITHOUT CONTRAST TECHNIQUE: Multiplanar, multiecho pulse sequences of  the brain and surrounding structures were obtained without intravenous contrast. Angiographic images of the head were obtained using MRA technique without contrast. COMPARISON:  Prior CT from earlier same day. FINDINGS: MRI HEAD FINDINGS Brain: Generalized age-related cerebral atrophy. Scattered patchy T2/FLAIR hyperintensity within the periventricular and deep white matter both cerebral hemispheres most consistent with chronic small vessel ischemic changes, mild in nature. 9 mm focus of diffusion abnormality within the right thalamus, consistent with acute/ early subacute ischemic infarct. No associated hemorrhage or mass effect. No other evidence for acute infarction. Gray-white matter differentiation otherwise maintained. No other evidence for chronic infarction. No acute or chronic intracranial hemorrhage. No mass lesion, midline shift or mass effect. No hydrocephalus. No extra-axial fluid collection. Dural sinuses are patent. Pituitary gland suprasellar region normal. Vascular: Major intracranial vascular flow voids maintained. Skull and upper cervical spine: Craniocervical junction normal. Upper cervical spine within normal limits. Bone marrow signal intensity normal. No scalp soft tissue abnormality. Sinuses/Orbits: Globes and orbital soft tissues within normal limits. Scattered mucosal thickening within the ethmoidal air cells and maxillary sinuses. Small retention cysts noted within left maxillary sinus. Small bilateral mastoid effusions. Inner ear structures normal. Other: None. MRA HEAD FINDINGS ANTERIOR CIRCULATION: Right ICA widely patent to the terminus without flow-limiting stenosis. Left ICA diminutive and somewhat attenuated as compared to  the right, which may reflect sequelae of a proximal stenosis. Scattered atheromatous irregularity within the petrous, cavernous, and supraclinoid left ICA without flow-limiting stenosis. Right A1 segment dominant and patent. Left A1 segment hypoplastic but patent  as well, which likely in part accounts for the slightly diminutive left ICA. Anterior communicating artery normal. Anterior cerebral artery is patent to their distal aspects without stenosis. Atheromatous irregularity within the right M1 segment with mild to moderate multifocal stenoses at its distal aspect (series 103, image 1). Distal right MCA branches demonstrate atheromatous irregularity but are well perfused. Attenuated irregular flow present within the left M1 segment, with moderate to severe narrowing at its distal aspect (Series 2, image 73). Left MCA branches are perfused distally, but are attenuated as compared to the right. POSTERIOR CIRCULATION: Vertebral artery's patent to the vertebrobasilar junction without stenosis. Posterior inferior cerebral arteries patent proximally. Basilar artery patent to its distal aspect without stenosis. Superior cerebral arteries patent bilaterally. Fetal type origin of the right PCA supplied via a patent right posterior communicating artery. Left PCA supplied via the basilar. Moderate multifocal atheromatous irregularity within the PCAs bilaterally, left worse than right. Superimposed moderate mid left P2 stenosis (series 105, image 5). No aneurysm. IMPRESSION: MRI HEAD IMPRESSION: 1. 9 mm acute/early subacute ischemic nonhemorrhagic right thalamic lacunar infarct. 2. Mild chronic microvascular ischemic disease for age. MRA HEAD IMPRESSION: 1. Attenuation of the left ICA as compared to the right, which may reflect a proximal stenosis. Vascular imaging of the neck with either CTA and/or MRI recommended. 2. Moderate intracranial atherosclerosis involving the anterior and posterior circulations as above. Most notable changes include moderate to severe atheromatous narrowing of the distal M1 segments bilaterally, left worse than right. Moderate multifocal irregularity within the bilateral PCAs, also worse on the left. Electronically Signed   By: Jeannine Boga M.D.    On: 08/30/2017 22:05   Mr Jodene Nam Head/brain DZ Cm  Result Date: 08/30/2017 CLINICAL DATA:  Initial evaluation for intermittent left-sided facial and upper extremity numbness. EXAM: MRI HEAD WITHOUT CONTRAST MRA HEAD WITHOUT CONTRAST TECHNIQUE: Multiplanar, multiecho pulse sequences of the brain and surrounding structures were obtained without intravenous contrast. Angiographic images of the head were obtained using MRA technique without contrast. COMPARISON:  Prior CT from earlier same day. FINDINGS: MRI HEAD FINDINGS Brain: Generalized age-related cerebral atrophy. Scattered patchy T2/FLAIR hyperintensity within the periventricular and deep white matter both cerebral hemispheres most consistent with chronic small vessel ischemic changes, mild in nature. 9 mm focus of diffusion abnormality within the right thalamus, consistent with acute/ early subacute ischemic infarct. No associated hemorrhage or mass effect. No other evidence for acute infarction. Gray-white matter differentiation otherwise maintained. No other evidence for chronic infarction. No acute or chronic intracranial hemorrhage. No mass lesion, midline shift or mass effect. No hydrocephalus. No extra-axial fluid collection. Dural sinuses are patent. Pituitary gland suprasellar region normal. Vascular: Major intracranial vascular flow voids maintained. Skull and upper cervical spine: Craniocervical junction normal. Upper cervical spine within normal limits. Bone marrow signal intensity normal. No scalp soft tissue abnormality. Sinuses/Orbits: Globes and orbital soft tissues within normal limits. Scattered mucosal thickening within the ethmoidal air cells and maxillary sinuses. Small retention cysts noted within left maxillary sinus. Small bilateral mastoid effusions. Inner ear structures normal. Other: None. MRA HEAD FINDINGS ANTERIOR CIRCULATION: Right ICA widely patent to the terminus without flow-limiting stenosis. Left ICA diminutive and somewhat  attenuated as compared to the right, which may reflect sequelae of a proximal stenosis. Scattered atheromatous irregularity  within the petrous, cavernous, and supraclinoid left ICA without flow-limiting stenosis. Right A1 segment dominant and patent. Left A1 segment hypoplastic but patent as well, which likely in part accounts for the slightly diminutive left ICA. Anterior communicating artery normal. Anterior cerebral artery is patent to their distal aspects without stenosis. Atheromatous irregularity within the right M1 segment with mild to moderate multifocal stenoses at its distal aspect (series 103, image 1). Distal right MCA branches demonstrate atheromatous irregularity but are well perfused. Attenuated irregular flow present within the left M1 segment, with moderate to severe narrowing at its distal aspect (Series 2, image 73). Left MCA branches are perfused distally, but are attenuated as compared to the right. POSTERIOR CIRCULATION: Vertebral artery's patent to the vertebrobasilar junction without stenosis. Posterior inferior cerebral arteries patent proximally. Basilar artery patent to its distal aspect without stenosis. Superior cerebral arteries patent bilaterally. Fetal type origin of the right PCA supplied via a patent right posterior communicating artery. Left PCA supplied via the basilar. Moderate multifocal atheromatous irregularity within the PCAs bilaterally, left worse than right. Superimposed moderate mid left P2 stenosis (series 105, image 5). No aneurysm. IMPRESSION: MRI HEAD IMPRESSION: 1. 9 mm acute/early subacute ischemic nonhemorrhagic right thalamic lacunar infarct. 2. Mild chronic microvascular ischemic disease for age. MRA HEAD IMPRESSION: 1. Attenuation of the left ICA as compared to the right, which may reflect a proximal stenosis. Vascular imaging of the neck with either CTA and/or MRI recommended. 2. Moderate intracranial atherosclerosis involving the anterior and posterior  circulations as above. Most notable changes include moderate to severe atheromatous narrowing of the distal M1 segments bilaterally, left worse than right. Moderate multifocal irregularity within the bilateral PCAs, also worse on the left. Electronically Signed   By: Jeannine Boga M.D.   On: 08/30/2017 22:05   Ct Head Code Stroke Wo Contrast  Result Date: 08/30/2017 CLINICAL DATA:  Code stroke. Focal neuro deficit less than 6 hours. Left-sided numbness EXAM: CT HEAD WITHOUT CONTRAST TECHNIQUE: Contiguous axial images were obtained from the base of the skull through the vertex without intravenous contrast. COMPARISON:  None. FINDINGS: Brain: Mild hypodensity in the cerebral white matter bilaterally is symmetric. No acute infarct, hemorrhage, mass. Ventricle size normal. Vascular: Negative for hyperdense vessel. Skull: Negative Sinuses/Orbits: Mild mucosal edema in the ethmoid sinuses. 7 mm osteoma left ethmoid sinus. Normal orbit. Other: None ASPECTS ([place] Stroke Program Early CT Score) - Ganglionic level infarction (caudate, lentiform nuclei, internal capsule, insula, M1-M3 cortex): 7 - Supraganglionic infarction (M4-M6 cortex): 3 Total score (0-10 with 10 being normal): 10 IMPRESSION: 1. No acute abnormality 2. ASPECTS is 10 These results were called by telephone at the time of interpretation on 08/30/2017 at 10:50 am to Dr. Harvest Dark , who verbally acknowledged these results. Electronically Signed   By: Franchot Gallo M.D.   On: 08/30/2017 10:51    Assessment: 66 y.o. male  male with hx of HTN, DM presented with L side numbness.  Pt was last known well at 8:30 AM. On exam pt has subjective numbness on L face and LUE.  NIHSS of 1.  No focal motor deficits. Pt had similar symptoms on Monday lasting one hour that have self resolved.  No anti platelet therapy at home.  CTH no acute abnormalities    Stroke Risk Factors - hyperlipidemia, hypertension and smoking   Not TPA candidate as low  NIHSS of 1. Anti platelet therapy and statin.  R thalamic infarct.  MRA significant intracranial stenosis  - Pt needs  daily ASA 81 and plavix 75 daily along with statin - d/c planning today - d/w pt and family   08/31/2017, 10:37 AM

## 2017-08-31 NOTE — Care Management Obs Status (Signed)
[place] NOTIFICATION   Patient Details  Name: Richard PRIME Sr. MRN: [id number] Date of Birth: 17-May-1951   Medicare Observation Status Notification Given:  Yes    Shelbie Ammons, RN 08/31/2017, 8:50 AM

## 2017-08-31 NOTE — Progress Notes (Signed)
SLP Cancellation Note  Patient Details Name: Richard RADILLA Sr. MRN: [id number] DOB: November 17, 1951   Cancelled treatment:       Reason Eval/Treat Not Completed: SLP screened, no needs identified, will sign off (chart reviewed; consulted NSG then met w/ pt/family)Pt denied any difficulty swallowing and is currently on a regular diet; tolerates swallowing pills w/ water per NSG. Pt conversed at conversational level w/out deficits noted; pt and family denied any speech-language deficits.  No further skilled ST services indicated as pt appears at his baseline. Pt agreed. NSG to reconsult if any change in status.     Orinda Kenner, MS, CCC-SLP Watson,Katherine 08/31/2017, 12:21 PM

## 2017-08-31 NOTE — Care Management Note (Signed)
Case Management Note  Patient Details  Name: Richard CLINGENPEEL Sr. MRN: [id number] Date of Birth: [birth year]  Subjective/Objective:                  Admitted to [facility] under observation status with the diagnosis of TIA. Lives with Fitzgerald [phone]). Last seen Othelia Pulling PA-C 4-6 months ago. Prescriptions are filled at [facility] in [place]. No home Health. No skilled facility. No no home oxygen. No medical equipment in the home. Takes care of all basic activities of daily living himself, drives. No falls. Good appetite. Dixie will drive.  Action/Plan: No follow-up needs identified at this time   Expected Discharge Date:  08/31/17               Expected Discharge Plan:     In-House Referral:     Discharge planning Services     Post Acute Care Choice:    Choice offered to:     DME Arranged:    DME Agency:     HH Arranged:    HH Agency:     Status of Service:     If discussed at [employer] [facility] [employer], dates discussed:    Additional Comments:  Shelbie Ammons, RN MSN CCM Care Management [phone] 08/31/2017, 8:50 AM

## 2017-09-01 NOTE — Discharge Summary (Signed)
[place] [facility] [place] NAME: Richard Thornton    MR#:  [id number]  DATE OF BIRTH:  06-08-1951  DATE OF ADMISSION:  08/30/2017   ADMITTING PHYSICIAN: Nicholes Mango, MD  DATE OF DISCHARGE: 08/31/2017  1:18 PM  PRIMARY CARE PHYSICIAN: Nathaneil Canary, PA-C   ADMISSION DIAGNOSIS:   TIA (transient ischemic attack) [G45.9] Transient cerebral ischemia, unspecified type [G45.9]  DISCHARGE DIAGNOSIS:   Active Problems:   TIA (transient ischemic attack)   SECONDARY DIAGNOSIS:   Past Medical History:  Diagnosis Date  . Arthritis   . Diabetes mellitus without complication ([place])   . GERD (gastroesophageal reflux disease)   . Hemorrhoid     HOSPITAL COURSE:   66 year old male with past medical history significant for uncontrolled diabetes mellitus, hypertension not in any medications, GERD, hyperlipidemia presents to hospital secondary to left facial and also left upper extremity numbness and tingling.  #1 acute right thalamic CVA-presenting with left-sided numbness and tingling. -MRI confirming right thalamic infarct. Patient has extensive small vessel disease and also atherosclerosis of the larger arteries noted on the MRA of the neck and head. - also has undiagnosed hypertension and uncontrolled lipid and sugars - Life style changes advised. -Neurology consult is appreciated. Worked with physical therapy and occupational therapy and they have not identified any outpatient needs. -Started on aspirin and Plavix due to significant small wrist disease. Also statin for his elevated LDL. -echocardiogram with no source of emboli noted.  #2 hypertension-intermittent elevation in blood pressure noted as outpatient. -Started on metoprolol and Norvasc at discharge.  #3 diabetes mellitus-since A1c came greater than 9, his metformin is being continued, and glipizide has been added. Low-dose glipizide added and he can be adjusted as outpatient. -life  style changes recommended  #4 hyperlipidemia-LDL was unable to be calculated since triglycerides were greater than 400. Started on statin.  #5 GERD-on Prilosec  Patient will be discharged home today in a stable condition and outpatient follow-up recommended    DISCHARGE CONDITIONS:   Guarded  CONSULTS OBTAINED:   Treatment Team:  Leotis Pain, MD  DRUG ALLERGIES:   No Known Allergies DISCHARGE MEDICATIONS:   Allergies as of 08/31/2017   No Known Allergies     Medication List    STOP taking these medications   doxycycline 100 MG capsule Commonly known as:  VIBRAMYCIN     TAKE these medications   amLODipine 5 MG tablet Commonly known as:  NORVASC Take 1 tablet (5 mg total) by mouth daily.   aspirin EC 81 MG tablet Take 1 tablet (81 mg total) by mouth daily.   atorvastatin 40 MG tablet Commonly known as:  LIPITOR Take 1 tablet (40 mg total) by mouth daily at 6 PM.   clopidogrel 75 MG tablet Commonly known as:  PLAVIX Take 1 tablet (75 mg total) by mouth daily.   glipiZIDE 2.5 MG 24 hr tablet Commonly known as:  GLIPIZIDE XL Take 1 tablet (2.5 mg total) by mouth daily with breakfast.   metFORMIN 500 MG tablet Commonly known as:  GLUCOPHAGE Take 1,000 mg by mouth 2 (two) times daily with a meal.   metoprolol tartrate 25 MG tablet Commonly known as:  LOPRESSOR Take 1 tablet (25 mg total) by mouth 2 (two) times daily.   omeprazole 20 MG tablet Commonly known as:  PRILOSEC OTC Take 20 mg by mouth daily.            Discharge Care Instructions  Start     Ordered   09/01/17 0000  amLODipine (NORVASC) 5 MG tablet  Daily     08/31/17 1201   08/31/17 0000  atorvastatin (LIPITOR) 40 MG tablet  Daily-1800     08/31/17 1201   08/31/17 0000  metoprolol tartrate (LOPRESSOR) 25 MG tablet  2 times daily     08/31/17 1201   08/31/17 0000  aspirin EC 81 MG tablet  Daily     08/31/17 1201   08/31/17 0000  clopidogrel (PLAVIX) 75 MG tablet  Daily      08/31/17 1201   08/31/17 0000  glipiZIDE (GLIPIZIDE XL) 2.5 MG 24 hr tablet  Daily with breakfast     08/31/17 1201   08/31/17 0000  Diet - low sodium heart healthy     08/31/17 1201   08/31/17 0000  Activity as tolerated - No restrictions     08/31/17 1201       DISCHARGE INSTRUCTIONS:   1. PCP follow-up in 1-2 weeks 2. Neurology follow-up in 3-4 weeks  DIET:   Cardiac diet and Diabetic diet  ACTIVITY:   Activity as tolerated  OXYGEN:   Home Oxygen: No.  Oxygen Delivery: room air  DISCHARGE LOCATION:   home   If you experience worsening of your admission symptoms, develop shortness of breath, life threatening emergency, suicidal or homicidal thoughts you must seek medical attention immediately by calling 911 or calling your MD immediately  if symptoms less severe.  You Must read complete instructions/literature along with all the possible adverse reactions/side effects for all the Medicines you take and that have been prescribed to you. Take any new Medicines after you have completely understood and accpet all the possible adverse reactions/side effects.   Please note  You were cared for by a hospitalist during your hospital stay. If you have any questions about your discharge medications or the care you received while you were in the hospital after you are discharged, you can call the unit and asked to speak with the hospitalist on call if the hospitalist that took care of you is not available. Once you are discharged, your primary care physician will handle any further medical issues. Please note that NO REFILLS for any discharge medications will be authorized once you are discharged, as it is imperative that you return to your primary care physician (or establish a relationship with a primary care physician if you do not have one) for your aftercare needs so that they can reassess your need for medications and monitor your lab values.    On the day of Discharge:    VITAL SIGNS:   Blood pressure (!) 154/80, pulse 60, temperature 98 F (36.7 C), resp. rate 16, height 5\' 10"  (1.778 m), weight 87.1 kg (192 lb), SpO2 96 %.  PHYSICAL EXAMINATION:    GENERAL:  66 y.o.-year-old patient lying in the bed with no acute distress.  EYES: Pupils equal, round, reactive to light and accommodation. No scleral icterus. Extraocular muscles intact.  HEENT: Head atraumatic, normocephalic. Oropharynx and nasopharynx clear.  NECK:  Supple, no jugular venous distention. No thyroid enlargement, no tenderness.  LUNGS: Normal breath sounds bilaterally, no wheezing, rales,rhonchi or crepitation. No use of accessory muscles of respiration.  CARDIOVASCULAR: S1, S2 normal. No murmurs, rubs, or gallops.  ABDOMEN: Soft, non-tender, non-distended. Bowel sounds present. No organomegaly or mass.  EXTREMITIES: No pedal edema, cyanosis, or clubbing.  NEUROLOGIC: Cranial nerves II through XII are intact. Muscle strength 5/5 in all extremities. Sensation  intact to light touch, however feels some paresthesias around the left side of his mouth and also left hand. Gait not checked.  PSYCHIATRIC: The patient is alert and oriented x 3.  SKIN: No obvious rash, lesion, or ulcer.   DATA REVIEW:   CBC  Recent Labs Lab 08/30/17 1041  WBC 6.8  HGB 16.3  HCT 47.4  PLT 149*    Chemistries   Recent Labs Lab 08/30/17 1041 08/31/17 0517  NA 137 135  K 4.3 4.1  CL 101 99*  CO2 25 26  GLUCOSE 211* 218*  BUN 16 16  CREATININE 0.90 0.79  CALCIUM 9.2 9.6  AST 32  --   ALT 37  --   ALKPHOS 72  --   BILITOT 1.0  --      Microbiology Results  No results found for this or any previous visit.  RADIOLOGY:  No results found.   Management plans discussed with the patient, family and they are in agreement.  CODE STATUS:  Code Status History    Date Active Date Inactive Code Status Order ID Comments User Context   08/30/2017 12:39 PM 08/31/2017  4:18 PM Full Code [id number]  Nicholes Mango, MD ED      TOTAL TIME TAKING CARE OF THIS PATIENT: 38 minutes.    Gladstone Lighter M.D on 09/01/2017 at 11:52 AM  Between 7am to 6pm - Pager - [phone]  After 6pm go to [email or web address] - password [email or web address] [facility]  [facility]  Office  [phone]  CC: Primary care physician; Nathaneil Canary, PA-C   Note: This dictation was prepared with Dragon dictation along with smaller phrase technology. Any transcriptional errors that result from this process are unintentional.

## 2017-09-11 DIAGNOSIS — G463 Brain stem stroke syndrome: Secondary | ICD-10-CM | POA: Diagnosis not present

## 2017-09-11 DIAGNOSIS — E119 Type 2 diabetes mellitus without complications: Secondary | ICD-10-CM | POA: Diagnosis not present

## 2017-09-24 DIAGNOSIS — R9082 White matter disease, unspecified: Secondary | ICD-10-CM | POA: Diagnosis not present

## 2017-09-24 DIAGNOSIS — I639 Cerebral infarction, unspecified: Secondary | ICD-10-CM | POA: Diagnosis not present

## 2017-10-05 DIAGNOSIS — I6381 Other cerebral infarction due to occlusion or stenosis of small artery: Secondary | ICD-10-CM | POA: Insufficient documentation

## 2017-10-05 DIAGNOSIS — I639 Cerebral infarction, unspecified: Secondary | ICD-10-CM | POA: Insufficient documentation

## 2017-12-27 DIAGNOSIS — I69359 Hemiplegia and hemiparesis following cerebral infarction affecting unspecified side: Secondary | ICD-10-CM | POA: Diagnosis not present

## 2017-12-27 DIAGNOSIS — I69398 Other sequelae of cerebral infarction: Secondary | ICD-10-CM | POA: Diagnosis not present

## 2018-01-30 DIAGNOSIS — N401 Enlarged prostate with lower urinary tract symptoms: Secondary | ICD-10-CM | POA: Diagnosis not present

## 2018-01-30 DIAGNOSIS — E119 Type 2 diabetes mellitus without complications: Secondary | ICD-10-CM | POA: Diagnosis not present

## 2018-01-30 DIAGNOSIS — G463 Brain stem stroke syndrome: Secondary | ICD-10-CM | POA: Diagnosis not present

## 2018-03-08 DIAGNOSIS — J019 Acute sinusitis, unspecified: Secondary | ICD-10-CM | POA: Diagnosis not present

## 2018-06-26 DIAGNOSIS — I69359 Hemiplegia and hemiparesis following cerebral infarction affecting unspecified side: Secondary | ICD-10-CM | POA: Diagnosis not present

## 2018-06-26 DIAGNOSIS — F410 Panic disorder [episodic paroxysmal anxiety] without agoraphobia: Secondary | ICD-10-CM | POA: Diagnosis not present

## 2018-06-26 DIAGNOSIS — I639 Cerebral infarction, unspecified: Secondary | ICD-10-CM | POA: Diagnosis not present

## 2018-06-26 DIAGNOSIS — I69398 Other sequelae of cerebral infarction: Secondary | ICD-10-CM | POA: Diagnosis not present

## 2018-07-26 DIAGNOSIS — E119 Type 2 diabetes mellitus without complications: Secondary | ICD-10-CM | POA: Diagnosis not present

## 2018-07-26 DIAGNOSIS — J019 Acute sinusitis, unspecified: Secondary | ICD-10-CM | POA: Diagnosis not present

## 2018-10-17 DIAGNOSIS — D485 Neoplasm of uncertain behavior of skin: Secondary | ICD-10-CM | POA: Diagnosis not present

## 2018-10-17 DIAGNOSIS — L578 Other skin changes due to chronic exposure to nonionizing radiation: Secondary | ICD-10-CM | POA: Diagnosis not present

## 2018-10-17 DIAGNOSIS — L918 Other hypertrophic disorders of the skin: Secondary | ICD-10-CM | POA: Diagnosis not present

## 2018-10-17 DIAGNOSIS — Z872 Personal history of diseases of the skin and subcutaneous tissue: Secondary | ICD-10-CM | POA: Diagnosis not present

## 2018-10-17 DIAGNOSIS — Z1283 Encounter for screening for malignant neoplasm of skin: Secondary | ICD-10-CM | POA: Diagnosis not present

## 2018-11-19 DIAGNOSIS — D225 Melanocytic nevi of trunk: Secondary | ICD-10-CM | POA: Diagnosis not present

## 2018-11-19 DIAGNOSIS — D485 Neoplasm of uncertain behavior of skin: Secondary | ICD-10-CM | POA: Diagnosis not present

## 2018-12-06 ENCOUNTER — Encounter: Payer: Self-pay | Admitting: Family Medicine

## 2018-12-06 ENCOUNTER — Ambulatory Visit (INDEPENDENT_AMBULATORY_CARE_PROVIDER_SITE_OTHER): Payer: Medicare Other | Admitting: Family Medicine

## 2018-12-06 VITALS — BP 180/94 | HR 55 | Temp 98.7°F | Ht 70.2 in | Wt 207.9 lb

## 2018-12-06 DIAGNOSIS — I152 Hypertension secondary to endocrine disorders: Secondary | ICD-10-CM

## 2018-12-06 DIAGNOSIS — I10 Essential (primary) hypertension: Secondary | ICD-10-CM

## 2018-12-06 DIAGNOSIS — Z8673 Personal history of transient ischemic attack (TIA), and cerebral infarction without residual deficits: Secondary | ICD-10-CM | POA: Diagnosis not present

## 2018-12-06 DIAGNOSIS — E1159 Type 2 diabetes mellitus with other circulatory complications: Secondary | ICD-10-CM

## 2018-12-06 DIAGNOSIS — E782 Mixed hyperlipidemia: Secondary | ICD-10-CM

## 2018-12-06 DIAGNOSIS — Z7689 Persons encountering health services in other specified circumstances: Secondary | ICD-10-CM

## 2018-12-06 DIAGNOSIS — E1149 Type 2 diabetes mellitus with other diabetic neurological complication: Secondary | ICD-10-CM | POA: Diagnosis not present

## 2018-12-06 DIAGNOSIS — F419 Anxiety disorder, unspecified: Secondary | ICD-10-CM | POA: Diagnosis not present

## 2018-12-06 DIAGNOSIS — E785 Hyperlipidemia, unspecified: Secondary | ICD-10-CM | POA: Insufficient documentation

## 2018-12-06 MED ORDER — AMLODIPINE BESYLATE 10 MG PO TABS: 10.0000 mg | ORAL_TABLET | Freq: Every day | ORAL | 3 refills | Status: DC

## 2018-12-06 MED ORDER — OMEPRAZOLE 20 MG PO CPDR: 20.0000 mg | DELAYED_RELEASE_CAPSULE | Freq: Every day | ORAL | 1 refills | Status: DC

## 2018-12-06 MED ORDER — ROSUVASTATIN CALCIUM 20 MG PO TABS: 20.0000 mg | ORAL_TABLET | Freq: Every day | ORAL | 1 refills | Status: DC

## 2018-12-06 MED ORDER — METFORMIN HCL 1000 MG PO TABS: 1000.0000 mg | ORAL_TABLET | Freq: Two times a day (BID) | ORAL | 1 refills | Status: DC

## 2018-12-06 MED ORDER — GABAPENTIN 300 MG PO CAPS: 300.0000 mg | ORAL_CAPSULE | Freq: Two times a day (BID) | ORAL | 1 refills | Status: DC | PRN

## 2018-12-06 MED ORDER — METOPROLOL SUCCINATE ER 25 MG PO TB24: 25.0000 mg | ORAL_TABLET | Freq: Every day | ORAL | 3 refills | Status: DC

## 2018-12-06 NOTE — Progress Notes (Signed)
BP (!) 180/94 (BP Location: Left Arm, Cuff Size: Normal)   Pulse (!) 55   Temp 98.7 F (37.1 C) (Oral)   Ht 5' 10.2" (1.783 m)   Wt 207 lb 14.4 oz (94.3 kg)   SpO2 96%   BMI 29.66 kg/m    Subjective:    Patient ID: Richard Russian Sr., male    DOB: 03/30/1951, 67 y.o.   MRN: [id number]  HPI: Richard BUSTA Sr. is a 67 y.o. male  Chief Complaint  Patient presents with  . Establish Care    no concerns, requesting gabapentin refill    Here today to establish care. Multiple medical problems, including HTN, hyperlipidemia, GERD, DM 2, arthritis, anxiety, and recent ischemic CVA. Has not had a PCP in quite some time, current medications started during admission 08/2018 for stroke. Has been following with Neurology as outpatient since then.   Does not check home BPs often, when Richard Thornton does check it's been about 140s/high 80s. Currently Taking 5 mg amlodipine and metoprolol 25 mg BID. Sometimes gets dizzy on standing and has occasional low HR. Denies CP, SOB, palpitations.   Hand and feet numbness and burning pain - taking about 600 mg daily split into two doses. This seems to help, and Richard Thornton notices when Richard Thornton forgets to take a dose.   Sometimes wakes up with anxiety attacks during the night since his stroke - was given a short supply of klonopin but hasn't taken it but two times. Has learned to distract his mind when this happens. Does not feel things are at a point now where Richard Thornton wants to get on regular medicine.   Taking metformin and glipizide for his diabetes. Has not been watching diet closely or exercising regularly. Does not check home sugars. Feels like his numbers are probably bad. No low blood sugar spells.   Getting significant myalgias in legs on lipitor but has been continuing to take it.   Depression screen [facility] 2/9 12/06/2018  Decreased Interest 0  Down, Depressed, Hopeless 0  PHQ - 2 Score 0  Altered sleeping 0  Tired, decreased energy 1  Change in appetite 0  Feeling bad or  failure about yourself  0  Trouble concentrating 0  Moving slowly or fidgety/restless 0  Suicidal thoughts 0  PHQ-9 Score 1  Difficult doing work/chores Not difficult at all   GAD 7 : Generalized Anxiety Score 12/06/2018  Nervous, Anxious, on Edge 1  Control/stop worrying 3  Worry too much - different things 0  Trouble relaxing 0  Restless 0  Easily annoyed or irritable 1  Afraid - awful might happen 1  Total GAD 7 Score 6  Anxiety Difficulty Somewhat difficult   Past Medical History:  Diagnosis Date  . Arthritis   . Diabetes mellitus without complication ([place])   . GERD (gastroesophageal reflux disease)   . Hemorrhoid   . Hyperlipidemia   . Hypertension   . Stroke [facility])    Social History   Socioeconomic History  . Marital status: Single    Spouse name: Not on file  . Number of children: Not on file  . Years of education: Not on file  . Highest education level: Not on file  Occupational History  . Not on file  Social Needs  . Financial resource strain: Not on file  . Food insecurity:    Worry: Not on file    Inability: Not on file  . Transportation needs:    Medical: Not on file  Non-medical: Not on file  Tobacco Use  . Smoking status: Former Smoker    Last attempt to quit: 12/18/2005    Years since quitting: 12.9  . Smokeless tobacco: Never Used  Substance and Sexual Activity  . Alcohol use: Yes    Alcohol/week: 8.0 standard drinks    Types: 6 Cans of beer, 2 Standard drinks or equivalent per week  . Drug use: No  . Sexual activity: Not Currently  Lifestyle  . Physical activity:    Days per week: Not on file    Minutes per session: Not on file  . Stress: Not on file  Relationships  . Social connections:    Talks on phone: Not on file    Gets together: Not on file    Attends religious service: Not on file    Active member of club or organization: Not on file    Attends meetings of clubs or organizations: Not on file    Relationship status: Not on  file  . Intimate partner violence:    Fear of current or ex partner: Not on file    Emotionally abused: Not on file    Physically abused: Not on file    Forced sexual activity: Not on file  Other Topics Concern  . Not on file  Social History Narrative  . Not on file    Relevant past medical, surgical, family and social history reviewed and updated as indicated. Interim medical history since our last visit reviewed. Allergies and medications reviewed and updated.  Review of Systems  Per HPI unless specifically indicated above     Objective:    BP (!) 180/94 (BP Location: Left Arm, Cuff Size: Normal)   Pulse (!) 55   Temp 98.7 F (37.1 C) (Oral)   Ht 5' 10.2" (1.783 m)   Wt 207 lb 14.4 oz (94.3 kg)   SpO2 96%   BMI 29.66 kg/m   Wt Readings from Last 3 Encounters:  12/06/18 207 lb 14.4 oz (94.3 kg)  08/30/17 192 lb (87.1 kg)  07/01/15 201 lb (91.2 kg)    Physical Exam Vitals signs and nursing note reviewed.  Constitutional:      Appearance: Normal appearance.  HENT:     Head: Atraumatic.  Eyes:     Extraocular Movements: Extraocular movements intact.     Conjunctiva/sclera: Conjunctivae normal.  Neck:     Musculoskeletal: Normal range of motion and neck supple.  Cardiovascular:     Rate and Rhythm: Normal rate and regular rhythm.  Pulmonary:     Effort: Pulmonary effort is normal.     Breath sounds: Normal breath sounds.  Musculoskeletal: Normal range of motion.  Skin:    General: Skin is warm and dry.  Neurological:     Mental Status: Richard Thornton is oriented to person, place, and time.     Motor: No weakness.  Psychiatric:        Mood and Affect: Mood normal.        Thought Content: Thought content normal.        Judgment: Judgment normal.     Results for orders placed or performed in visit on 12/06/18  Comprehensive metabolic panel  Result Value Ref Range   Glucose 189 (H) 65 - 99 mg/dL   BUN 13 8 - 27 mg/dL   Creatinine, Ser 1.13 0.76 - 1.27 mg/dL   GFR  calc non Af Amer 66 >59 mL/min/1.73   GFR calc Af Amer 76 >59 mL/min/1.73   BUN/Creatinine  Ratio 12 10 - 24   Sodium 137 134 - 144 mmol/L   Potassium 4.8 3.5 - 5.2 mmol/L   Chloride 97 96 - 106 mmol/L   CO2 21 20 - 29 mmol/L   Calcium 9.6 8.6 - 10.2 mg/dL   Total Protein 7.1 6.0 - 8.5 g/dL   Albumin 4.6 3.6 - 4.8 g/dL   Globulin, Total 2.5 1.5 - 4.5 g/dL   Albumin/Globulin Ratio 1.8 1.2 - 2.2   Bilirubin Total 0.6 0.0 - 1.2 mg/dL   Alkaline Phosphatase 77 39 - 117 IU/L   AST 22 0 - 40 IU/L   ALT 32 0 - 44 IU/L  Lipid Panel w/o Chol/HDL Ratio  Result Value Ref Range   Cholesterol, Total 110 100 - 199 mg/dL   Triglycerides 87 0 - 149 mg/dL   HDL 37 (L) >39 mg/dL   VLDL Cholesterol Cal 17 5 - 40 mg/dL   LDL Calculated 56 0 - 99 mg/dL  HgB A1c  Result Value Ref Range   Hgb A1c MFr Bld 8.5 (H) 4.8 - 5.6 %   Est. average glucose Bld gHb Est-mCnc 197 mg/dL      Assessment & Plan:   Problem List Items Addressed This Visit      Cardiovascular and Mediastinum   Hypertension associated with diabetes ([place])    BP just above goal and HRs dipping too low at times. Reduce metoprolol to 25 mg XL and increase amlodipine to 10 mg. Continue home monitoring.       Relevant Medications   aspirin EC 81 MG tablet   metoprolol succinate (TOPROL-XL) 25 MG 24 hr tablet   amLODipine (NORVASC) 10 MG tablet   rosuvastatin (CRESTOR) 20 MG tablet   metFORMIN (GLUCOPHAGE) 1000 MG tablet   Other Relevant Orders   Comprehensive metabolic panel (Completed)     Endocrine   Type 2 diabetes mellitus with neurological complications (HCC) - Primary    Check A1C and adjust as needed. Work hard on lifestyle modifications for improved control      Relevant Medications   aspirin EC 81 MG tablet   rosuvastatin (CRESTOR) 20 MG tablet   metFORMIN (GLUCOPHAGE) 1000 MG tablet   Other Relevant Orders   HgB A1c (Completed)     Other   Hyperlipidemia    Recheck lipids, switch to crestor given myalgias with  lipitor. Continue to monitor      Relevant Medications   aspirin EC 81 MG tablet   metoprolol succinate (TOPROL-XL) 25 MG 24 hr tablet   amLODipine (NORVASC) 10 MG tablet   rosuvastatin (CRESTOR) 20 MG tablet   Other Relevant Orders   Lipid Panel w/o Chol/HDL Ratio (Completed)   History of CVA (cerebrovascular accident)    Followed by neurology, continue current regimen      Anxiety    Under good control with redirecting thoughts at this time. Has some klonopin on hand that Richard Thornton can take for severe instances but does not wish to start any medications for this at this time. Continue to monitor       Other Visit Diagnoses    Encounter to establish care           Follow up plan: Return in about 3 months (around 03/07/2019) for A1C, BP.

## 2018-12-07 LAB — COMPREHENSIVE METABOLIC PANEL
ALT: 32 IU/L (ref 0–44)
AST: 22 IU/L (ref 0–40)
Albumin/Globulin Ratio: 1.8 (ref 1.2–2.2)
Albumin: 4.6 g/dL (ref 3.6–4.8)
Alkaline Phosphatase: 77 IU/L (ref 39–117)
BUN/Creatinine Ratio: 12 (ref 10–24)
BUN: 13 mg/dL (ref 8–27)
Bilirubin Total: 0.6 mg/dL (ref 0.0–1.2)
CO2: 21 mmol/L (ref 20–29)
CREATININE: 1.13 mg/dL (ref 0.76–1.27)
Calcium: 9.6 mg/dL (ref 8.6–10.2)
Chloride: 97 mmol/L (ref 96–106)
GFR calc Af Amer: 76 mL/min/{1.73_m2} (ref >59)
GFR calc non Af Amer: 66 mL/min/{1.73_m2} (ref >59)
GLUCOSE: 189 mg/dL — AB (ref 65–99)
Globulin, Total: 2.5 g/dL (ref 1.5–4.5)
Potassium: 4.8 mmol/L (ref 3.5–5.2)
SODIUM: 137 mmol/L (ref 134–144)
Total Protein: 7.1 g/dL (ref 6.0–8.5)

## 2018-12-07 LAB — LIPID PANEL W/O CHOL/HDL RATIO
Cholesterol, Total: 110 mg/dL (ref 100–199)
HDL: 37 mg/dL — ABNORMAL LOW (ref >39)
LDL CALC: 56 mg/dL (ref 0–99)
Triglycerides: 87 mg/dL (ref 0–149)
VLDL Cholesterol Cal: 17 mg/dL (ref 5–40)

## 2018-12-07 LAB — HEMOGLOBIN A1C
Est. average glucose Bld gHb Est-mCnc: 197 mg/dL
Hgb A1c MFr Bld: 8.5 % — ABNORMAL HIGH (ref 4.8–5.6)

## 2018-12-09 ENCOUNTER — Other Ambulatory Visit: Payer: Self-pay | Admitting: Family Medicine

## 2018-12-09 DIAGNOSIS — F419 Anxiety disorder, unspecified: Secondary | ICD-10-CM | POA: Insufficient documentation

## 2018-12-09 DIAGNOSIS — Z8673 Personal history of transient ischemic attack (TIA), and cerebral infarction without residual deficits: Secondary | ICD-10-CM | POA: Insufficient documentation

## 2018-12-09 MED ORDER — DAPAGLIFLOZIN PROPANEDIOL 5 MG PO TABS: 5.0000 mg | ORAL_TABLET | Freq: Every day | ORAL | 2 refills | Status: DC

## 2018-12-09 NOTE — Assessment & Plan Note (Signed)
BP just above goal and HRs dipping too low at times. Reduce metoprolol to 25 mg XL and increase amlodipine to 10 mg. Continue home monitoring.

## 2018-12-09 NOTE — Progress Notes (Signed)
fa

## 2018-12-09 NOTE — Assessment & Plan Note (Signed)
Check A1C and adjust as needed. Work hard on lifestyle modifications for improved control

## 2018-12-09 NOTE — Assessment & Plan Note (Signed)
Recheck lipids, switch to crestor given myalgias with lipitor. Continue to monitor

## 2018-12-09 NOTE — Assessment & Plan Note (Signed)
Followed by neurology, continue current regimen

## 2018-12-09 NOTE — Assessment & Plan Note (Signed)
Under good control with redirecting thoughts at this time. Has some klonopin on hand that he can take for severe instances but does not wish to start any medications for this at this time. Continue to monitor

## 2018-12-30 DIAGNOSIS — F410 Panic disorder [episodic paroxysmal anxiety] without agoraphobia: Secondary | ICD-10-CM | POA: Diagnosis not present

## 2018-12-30 DIAGNOSIS — I69398 Other sequelae of cerebral infarction: Secondary | ICD-10-CM | POA: Diagnosis not present

## 2018-12-30 DIAGNOSIS — I69359 Hemiplegia and hemiparesis following cerebral infarction affecting unspecified side: Secondary | ICD-10-CM | POA: Diagnosis not present

## 2019-01-05 ENCOUNTER — Encounter: Payer: Self-pay | Admitting: Family Medicine

## 2019-01-15 ENCOUNTER — Encounter: Payer: Self-pay | Admitting: Family Medicine

## 2019-01-15 ENCOUNTER — Ambulatory Visit (INDEPENDENT_AMBULATORY_CARE_PROVIDER_SITE_OTHER): Payer: PPO | Admitting: Family Medicine

## 2019-01-15 VITALS — BP 184/84 | HR 72 | Temp 98.0°F | Ht 70.2 in | Wt 208.0 lb

## 2019-01-15 DIAGNOSIS — E1159 Type 2 diabetes mellitus with other circulatory complications: Secondary | ICD-10-CM

## 2019-01-15 DIAGNOSIS — R21 Rash and other nonspecific skin eruption: Secondary | ICD-10-CM | POA: Diagnosis not present

## 2019-01-15 DIAGNOSIS — J069 Acute upper respiratory infection, unspecified: Secondary | ICD-10-CM | POA: Diagnosis not present

## 2019-01-15 DIAGNOSIS — I10 Essential (primary) hypertension: Secondary | ICD-10-CM

## 2019-01-15 MED ORDER — TRIAMCINOLONE ACETONIDE 0.1 % EX CREA: 1.0000 "application " | TOPICAL_CREAM | Freq: Two times a day (BID) | CUTANEOUS | 0 refills | Status: DC

## 2019-01-15 MED ORDER — AMOXICILLIN-POT CLAVULANATE 875-125 MG PO TABS: 1.0000 | ORAL_TABLET | Freq: Two times a day (BID) | ORAL | 0 refills | Status: DC

## 2019-01-15 NOTE — Assessment & Plan Note (Signed)
High today as he was taking sudafed without realizing it was hard on his BP. Running well when not sick. Will recheck at upcoming appt in 2 months. Continue current regimen

## 2019-01-15 NOTE — Progress Notes (Signed)
BP (!) 184/84   Pulse 72   Temp 98 F (36.7 C) (Oral)   Ht 5' 10.2" (1.783 m)   Wt 208 lb (94.3 kg)   SpO2 98%   BMI 29.68 kg/m    Subjective:    Patient ID: Richard Russian Sr., male    DOB: 1951/05/24, 68 y.o.   MRN: [id number]  HPI: Richard BUGARIN Sr. is a 68 y.o. male  Chief Complaint  Patient presents with  . Nasal Congestion  . Fever  . Headache    x 1 week. Was getting better, but has reoccured in last few days.    Headaches, drainage, chest congestion, low grade fevers, chest tightness x 1 week. Was improving but now getting worse. Taking decongestants and mucinex with no improvement. Several sick contacts.   Home BPs running 140/70 since switching to metoprolol 25 mg XL and increasing amlodipine to 10 mg. Had one episode of LE edema but since has had no issue. Denies CP, SOB, dizziness, HAs.   Having an itchy red rash intermittently on medial LEs. Hydrocortisone helps, but it's not going away. No new products, recent outdoor exposures.   Past Medical History:  Diagnosis Date  . Arthritis   . Diabetes mellitus without complication ([place])   . GERD (gastroesophageal reflux disease)   . Hemorrhoid   . Hyperlipidemia   . Hypertension   . Stroke [facility])    Social History   Socioeconomic History  . Marital status: Single    Spouse name: Not on file  . Number of children: Not on file  . Years of education: Not on file  . Highest education level: Not on file  Occupational History  . Not on file  Social Needs  . Financial resource strain: Not on file  . Food insecurity:    Worry: Not on file    Inability: Not on file  . Transportation needs:    Medical: Not on file    Non-medical: Not on file  Tobacco Use  . Smoking status: Former Smoker    Last attempt to quit: 12/18/2005    Years since quitting: 13.0  . Smokeless tobacco: Never Used  Substance and Sexual Activity  . Alcohol use: Yes    Alcohol/week: 8.0 standard drinks    Types: 6 Cans of beer, 2 Standard  drinks or equivalent per week  . Drug use: No  . Sexual activity: Not Currently  Lifestyle  . Physical activity:    Days per week: Not on file    Minutes per session: Not on file  . Stress: Not on file  Relationships  . Social connections:    Talks on phone: Not on file    Gets together: Not on file    Attends religious service: Not on file    Active member of club or organization: Not on file    Attends meetings of clubs or organizations: Not on file    Relationship status: Not on file  . Intimate partner violence:    Fear of current or ex partner: Not on file    Emotionally abused: Not on file    Physically abused: Not on file    Forced sexual activity: Not on file  Other Topics Concern  . Not on file  Social History Narrative  . Not on file    Relevant past medical, surgical, family and social history reviewed and updated as indicated. Interim medical history since our last visit reviewed. Allergies and medications reviewed and  updated.  Review of Systems  Per HPI unless specifically indicated above     Objective:    BP (!) 184/84   Pulse 72   Temp 98 F (36.7 C) (Oral)   Ht 5' 10.2" (1.783 m)   Wt 208 lb (94.3 kg)   SpO2 98%   BMI 29.68 kg/m   Wt Readings from Last 3 Encounters:  01/15/19 208 lb (94.3 kg)  12/06/18 207 lb 14.4 oz (94.3 kg)  08/30/17 192 lb (87.1 kg)    Physical Exam Vitals signs and nursing note reviewed.  Constitutional:      Appearance: He is well-developed.  HENT:     Head: Atraumatic.     Right Ear: Tympanic membrane and external ear normal.     Left Ear: Tympanic membrane and external ear normal.     Nose: Congestion present.     Mouth/Throat:     Pharynx: Posterior oropharyngeal erythema present. No oropharyngeal exudate.  Eyes:     Conjunctiva/sclera: Conjunctivae normal.     Pupils: Pupils are equal, round, and reactive to light.  Neck:     Musculoskeletal: Normal range of motion and neck supple.  Cardiovascular:      Rate and Rhythm: Normal rate and regular rhythm.  Pulmonary:     Effort: Pulmonary effort is normal. No respiratory distress.     Breath sounds: No wheezing or rales.  Musculoskeletal: Normal range of motion.  Lymphadenopathy:     Cervical: No cervical adenopathy.  Skin:    General: Skin is warm and dry.     Findings: Rash (erythematous maculopapular rash right medial lower leg) present.  Neurological:     Mental Status: He is alert and oriented to person, place, and time.  Psychiatric:        Behavior: Behavior normal.     Results for orders placed or performed in visit on 12/06/18  Comprehensive metabolic panel  Result Value Ref Range   Glucose 189 (H) 65 - 99 mg/dL   BUN 13 8 - 27 mg/dL   Creatinine, Ser 1.13 0.76 - 1.27 mg/dL   GFR calc non Af Amer 66 >59 mL/min/1.73   GFR calc Af Amer 76 >59 mL/min/1.73   BUN/Creatinine Ratio 12 10 - 24   Sodium 137 134 - 144 mmol/L   Potassium 4.8 3.5 - 5.2 mmol/L   Chloride 97 96 - 106 mmol/L   CO2 21 20 - 29 mmol/L   Calcium 9.6 8.6 - 10.2 mg/dL   Total Protein 7.1 6.0 - 8.5 g/dL   Albumin 4.6 3.6 - 4.8 g/dL   Globulin, Total 2.5 1.5 - 4.5 g/dL   Albumin/Globulin Ratio 1.8 1.2 - 2.2   Bilirubin Total 0.6 0.0 - 1.2 mg/dL   Alkaline Phosphatase 77 39 - 117 IU/L   AST 22 0 - 40 IU/L   ALT 32 0 - 44 IU/L  Lipid Panel w/o Chol/HDL Ratio  Result Value Ref Range   Cholesterol, Total 110 100 - 199 mg/dL   Triglycerides 87 0 - 149 mg/dL   HDL 37 (L) >39 mg/dL   VLDL Cholesterol Cal 17 5 - 40 mg/dL   LDL Calculated 56 0 - 99 mg/dL  HgB A1c  Result Value Ref Range   Hgb A1c MFr Bld 8.5 (H) 4.8 - 5.6 %   Est. average glucose Bld gHb Est-mCnc 197 mg/dL      Assessment & Plan:   Problem List Items Addressed This Visit      Cardiovascular  and Mediastinum   Hypertension associated with diabetes ([place])    High today as he was taking sudafed without realizing it was hard on his BP. Running well when not sick. Will recheck at upcoming  appt in 2 months. Continue current regimen       Other Visit Diagnoses    Upper respiratory tract infection, unspecified type    -  Primary   Tx with augmentin, plain mucinex, sinus rinses. F/u if worsening or not improving   Rash       Tx with triamcinolone prn, good moisturizer regimen. F/u if not improving       Follow up plan: Return for as scheduled.

## 2019-02-19 ENCOUNTER — Encounter: Payer: Self-pay | Admitting: Family Medicine

## 2019-02-19 NOTE — Telephone Encounter (Signed)
What is the question? 

## 2019-02-19 NOTE — Telephone Encounter (Signed)
Called and spoke with patient. He stated that he has been taking his gabapentin TID and his glipizide BID, because that's how "Fritz Pickerel" told him to take his medications.   Pt clarified that Fritz Pickerel is Othelia Pulling, P.A. former provider.   Please advise.

## 2019-02-20 MED ORDER — GABAPENTIN 300 MG PO CAPS: 300.0000 mg | ORAL_CAPSULE | Freq: Three times a day (TID) | ORAL | 0 refills | Status: DC | PRN

## 2019-02-20 MED ORDER — GLIPIZIDE ER 5 MG PO TB24: 5.0000 mg | ORAL_TABLET | Freq: Every day | ORAL | 0 refills | Status: DC

## 2019-02-20 NOTE — Telephone Encounter (Signed)
Patient wanted to know if you would refill them as below.

## 2019-02-20 NOTE — Telephone Encounter (Signed)
Sent over his medications as he requested, with change from BID to TID on his gabapentin and I changed his glipizide dose to 5 mg because we had him at 2.5 mg XL and he's requesting BID dosing (no need to dose BID on extended release tabs)

## 2019-03-09 ENCOUNTER — Telehealth: Payer: Self-pay | Admitting: Family Medicine

## 2019-03-09 NOTE — Telephone Encounter (Signed)
Patient contacted to offer video/telephonic visit or to reschedule appointment. Patient stated that he would prefer to keep his appointment and will wait in his car until his appointment to allow social distance.

## 2019-03-10 ENCOUNTER — Ambulatory Visit: Payer: Medicare Other | Admitting: Family Medicine

## 2019-03-10 NOTE — Telephone Encounter (Signed)
Please get him scheduled sometime this week for virtual visit

## 2019-03-10 NOTE — Telephone Encounter (Signed)
Called pt to check on him per Apolonio Schneiders since he was going to come to his appointment. Pt stated that he woke up with a cough and runny eyes so he decided to stay home. Per rachel will set up evisit when able to.

## 2019-03-13 ENCOUNTER — Other Ambulatory Visit: Payer: Self-pay

## 2019-03-13 ENCOUNTER — Other Ambulatory Visit: Payer: PPO

## 2019-03-13 ENCOUNTER — Encounter: Payer: Self-pay | Admitting: Family Medicine

## 2019-03-13 ENCOUNTER — Ambulatory Visit (INDEPENDENT_AMBULATORY_CARE_PROVIDER_SITE_OTHER): Payer: PPO | Admitting: Family Medicine

## 2019-03-13 DIAGNOSIS — E1159 Type 2 diabetes mellitus with other circulatory complications: Secondary | ICD-10-CM

## 2019-03-13 DIAGNOSIS — E782 Mixed hyperlipidemia: Secondary | ICD-10-CM | POA: Diagnosis not present

## 2019-03-13 DIAGNOSIS — E1149 Type 2 diabetes mellitus with other diabetic neurological complication: Secondary | ICD-10-CM

## 2019-03-13 DIAGNOSIS — I10 Essential (primary) hypertension: Secondary | ICD-10-CM

## 2019-03-13 DIAGNOSIS — Z8673 Personal history of transient ischemic attack (TIA), and cerebral infarction without residual deficits: Secondary | ICD-10-CM | POA: Diagnosis not present

## 2019-03-13 DIAGNOSIS — F419 Anxiety disorder, unspecified: Secondary | ICD-10-CM

## 2019-03-13 NOTE — Progress Notes (Signed)
There were no vitals taken for this visit.  Pt unable to provide vitals today, states he will call back once he can get access to a BP cuff and scale  Subjective:    Patient ID: Richard Russian Sr., male    DOB: 08/13/1951, 68 y.o.   MRN: [id number]  HPI: Richard SAPPENFIELD Sr. is a 68 y.o. male  Chief Complaint  Patient presents with  . Diabetes    36m f/u pt unable to provide his vital signs  . Hypertension    . This visit was completed via WebEx due to the restrictions of the COVID-19 pandemic. All issues as above were discussed and addressed. Physical exam was done as above through visual confirmation on WebEx. If it was felt that the patient should be evaluated in the office, they were directed there. The patient verbally consented to this visit. . Location of the patient: work . Location of the provider: work . Those involved with this call:  . Provider: Merrie Roof, PA-C . CMA: Gerda Diss, CMA . Front Desk/Registration: Jill Side  . Time spent on call: 24 minutes with patient face to face via video conference. More than 50% of this time was spent in counseling and coordination of care.   Following up today for 6 month f/u chronic conditions. No new concerns today, states he's feeling very well. Taking his medicines faithfully without side effects. Denies CP, SOB, HAs, dizziness, hypoglycemic episodes. Home BP readings running 130s/80s. Does not check home BSs. States his diet could use a lot of improvement and he does not exercise. Taking crestor without issue for cholesterol.  Followed by Neurology for recent hx of CVA. Currently just on aspirin.   Relevant past medical, surgical, family and social history reviewed and updated as indicated. Interim medical history since our last visit reviewed. Allergies and medications reviewed and updated.  Review of Systems  Per HPI unless specifically indicated above     Objective:    There were no vitals taken for this visit.  Wt  Readings from Last 3 Encounters:  01/15/19 208 lb (94.3 kg)  12/06/18 207 lb 14.4 oz (94.3 kg)  08/30/17 192 lb (87.1 kg)    Physical Exam Vitals signs and nursing note reviewed.  Constitutional:      General: He is not in acute distress.    Appearance: Normal appearance.  HENT:     Head: Atraumatic.     Right Ear: External ear normal.     Left Ear: External ear normal.     Mouth/Throat:     Mouth: Mucous membranes are moist.     Pharynx: Oropharynx is clear.  Eyes:     Extraocular Movements: Extraocular movements intact.     Conjunctiva/sclera: Conjunctivae normal.  Neck:     Musculoskeletal: Normal range of motion.  Cardiovascular:     Rate and Rhythm: Normal rate and regular rhythm.  Pulmonary:     Effort: Pulmonary effort is normal. No respiratory distress.  Musculoskeletal: Normal range of motion.  Skin:    General: Skin is dry.     Findings: No erythema or rash.  Neurological:     Mental Status: He is oriented to person, place, and time.  Psychiatric:        Mood and Affect: Mood normal.        Thought Content: Thought content normal.        Judgment: Judgment normal.     Results for orders placed or performed in  visit on 12/06/18  Comprehensive metabolic panel  Result Value Ref Range   Glucose 189 (H) 65 - 99 mg/dL   BUN 13 8 - 27 mg/dL   Creatinine, Ser 1.13 0.76 - 1.27 mg/dL   GFR calc non Af Amer 66 >59 mL/min/1.73   GFR calc Af Amer 76 >59 mL/min/1.73   BUN/Creatinine Ratio 12 10 - 24   Sodium 137 134 - 144 mmol/L   Potassium 4.8 3.5 - 5.2 mmol/L   Chloride 97 96 - 106 mmol/L   CO2 21 20 - 29 mmol/L   Calcium 9.6 8.6 - 10.2 mg/dL   Total Protein 7.1 6.0 - 8.5 g/dL   Albumin 4.6 3.6 - 4.8 g/dL   Globulin, Total 2.5 1.5 - 4.5 g/dL   Albumin/Globulin Ratio 1.8 1.2 - 2.2   Bilirubin Total 0.6 0.0 - 1.2 mg/dL   Alkaline Phosphatase 77 39 - 117 IU/L   AST 22 0 - 40 IU/L   ALT 32 0 - 44 IU/L  Lipid Panel w/o Chol/HDL Ratio  Result Value Ref Range    Cholesterol, Total 110 100 - 199 mg/dL   Triglycerides 87 0 - 149 mg/dL   HDL 37 (L) >39 mg/dL   VLDL Cholesterol Cal 17 5 - 40 mg/dL   LDL Calculated 56 0 - 99 mg/dL  HgB A1c  Result Value Ref Range   Hgb A1c MFr Bld 8.5 (H) 4.8 - 5.6 %   Est. average glucose Bld gHb Est-mCnc 197 mg/dL      Assessment & Plan:   Problem List Items Addressed This Visit      Cardiovascular and Mediastinum   Hypertension associated with diabetes ([place])    Stable and WNL per home logs, unable to get vitals today due to COVID 19 restrictions and lack of equipment. Continue current regimen. Call with persistent abnormals at home      Relevant Orders   Comprehensive metabolic panel (Completed)     Endocrine   Type 2 diabetes mellitus with neurological complications ([name]) - Primary    Will recheck A1C, pt wishes to work on diet improvements before adding medications on at this time if not to goal. Continue current regimen      Relevant Orders   HgB A1c (Completed)     Other   Hyperlipidemia    Recheck lipids, adjust as needed. Continue current regimen and lifestyle modifications      History of CVA (cerebrovascular accident)    Followed by Neurology, continue current regimen      Anxiety    Has small supply of klonopin at home, rarely uses. Continue current regimen          Follow up plan: Return in about 3 months (around 06/13/2019) for DM.

## 2019-03-14 LAB — COMPREHENSIVE METABOLIC PANEL
A/G RATIO: 1.9 (ref 1.2–2.2)
ALT: 23 IU/L (ref 0–44)
AST: 19 IU/L (ref 0–40)
Albumin: 4.7 g/dL (ref 3.8–4.8)
Alkaline Phosphatase: 87 IU/L (ref 39–117)
BILIRUBIN TOTAL: 0.5 mg/dL (ref 0.0–1.2)
BUN/Creatinine Ratio: 11 (ref 10–24)
BUN: 12 mg/dL (ref 8–27)
CO2: 24 mmol/L (ref 20–29)
Calcium: 9.3 mg/dL (ref 8.6–10.2)
Chloride: 100 mmol/L (ref 96–106)
Creatinine, Ser: 1.09 mg/dL (ref 0.76–1.27)
GFR calc Af Amer: 80 mL/min/{1.73_m2} (ref >59)
GFR calc non Af Amer: 69 mL/min/{1.73_m2} (ref >59)
GLUCOSE: 161 mg/dL — AB (ref 65–99)
Globulin, Total: 2.5 g/dL (ref 1.5–4.5)
Potassium: 5 mmol/L (ref 3.5–5.2)
Sodium: 139 mmol/L (ref 134–144)
Total Protein: 7.2 g/dL (ref 6.0–8.5)

## 2019-03-14 LAB — HEMOGLOBIN A1C
ESTIMATED AVERAGE GLUCOSE: 203 mg/dL
Hgb A1c MFr Bld: 8.7 % — ABNORMAL HIGH (ref 4.8–5.6)

## 2019-03-17 NOTE — Assessment & Plan Note (Signed)
Has small supply of klonopin at home, rarely uses. Continue current regimen

## 2019-03-17 NOTE — Assessment & Plan Note (Signed)
Followed by Neurology, continue current regimen

## 2019-03-17 NOTE — Assessment & Plan Note (Signed)
Stable and WNL per home logs, unable to get vitals today due to COVID 19 restrictions and lack of equipment. Continue current regimen. Call with persistent abnormals at home

## 2019-03-17 NOTE — Assessment & Plan Note (Signed)
Recheck lipids, adjust as needed. Continue current regimen and lifestyle modifications 

## 2019-03-17 NOTE — Assessment & Plan Note (Signed)
Will recheck A1C, pt wishes to work on diet improvements before adding medications on at this time if not to goal. Continue current regimen

## 2019-04-22 ENCOUNTER — Other Ambulatory Visit: Payer: Self-pay | Admitting: Family Medicine

## 2019-05-05 ENCOUNTER — Other Ambulatory Visit: Payer: Self-pay | Admitting: Family Medicine

## 2019-05-05 MED ORDER — GLIPIZIDE ER 5 MG PO TB24: 5.0000 mg | ORAL_TABLET | Freq: Every day | ORAL | 0 refills | Status: DC

## 2019-05-19 ENCOUNTER — Other Ambulatory Visit: Payer: Self-pay | Admitting: Family Medicine

## 2019-05-21 DIAGNOSIS — Z872 Personal history of diseases of the skin and subcutaneous tissue: Secondary | ICD-10-CM | POA: Diagnosis not present

## 2019-05-21 DIAGNOSIS — L578 Other skin changes due to chronic exposure to nonionizing radiation: Secondary | ICD-10-CM | POA: Diagnosis not present

## 2019-05-21 DIAGNOSIS — Z86018 Personal history of other benign neoplasm: Secondary | ICD-10-CM | POA: Diagnosis not present

## 2019-05-21 DIAGNOSIS — L720 Epidermal cyst: Secondary | ICD-10-CM | POA: Diagnosis not present

## 2019-05-29 ENCOUNTER — Other Ambulatory Visit: Payer: Self-pay | Admitting: Family Medicine

## 2019-06-24 IMAGING — CT CT ANGIO NECK
2 of 7 series · 8 of 33 positions shown · IV contrast (APPLIED)
Comparison: None.

CLINICAL DATA: Arterial stricture. Questioned proximal ICA
occlusion on MRA yesterday.

EXAM:
CT ANGIOGRAPHY NECK
TECHNIQUE: Multidetector CT imaging of the neck was performed using the
standard protocol during bolus administration of intravenous
contrast. Multiplanar CT image reconstructions and MIPs were
obtained to evaluate the vascular anatomy. Carotid stenosis
measurements (when applicable) are obtained utilizing NASCET
criteria, using the distal internal carotid diameter as the
denominator.
CONTRAST:  75 cc Isovue 370 intravenous

[Series 4: cta neck · axial · 0.60mm/px · z∈[+1228,+1322]mm · 2 of 141 slices shown]
[im 47/141  soft-tissue]
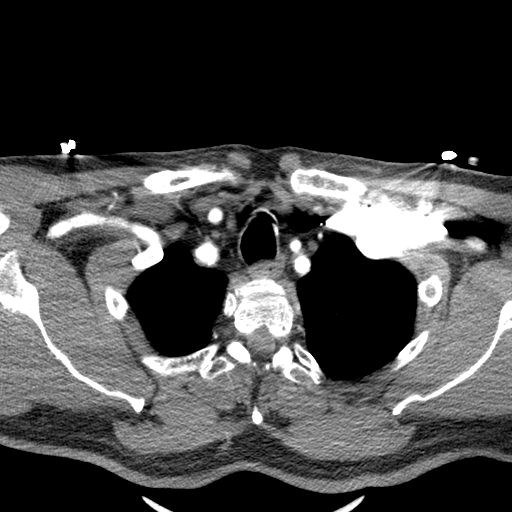
[im 94/141  soft-tissue]
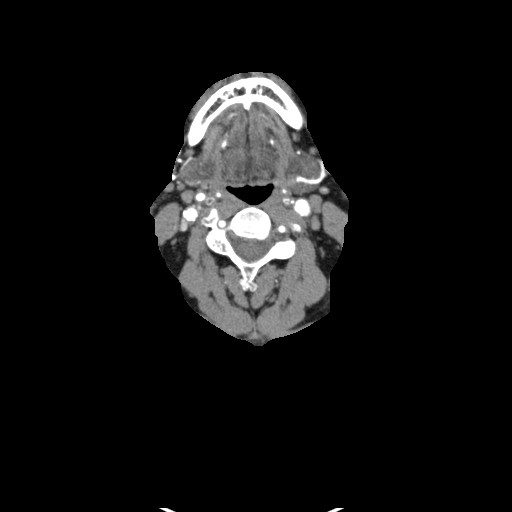

[Series 6: ax thin · axial · 0.39mm/px · z∈[+1177,+1377]mm · 6 of 282 slices shown]
[im 41/282  soft-tissue]
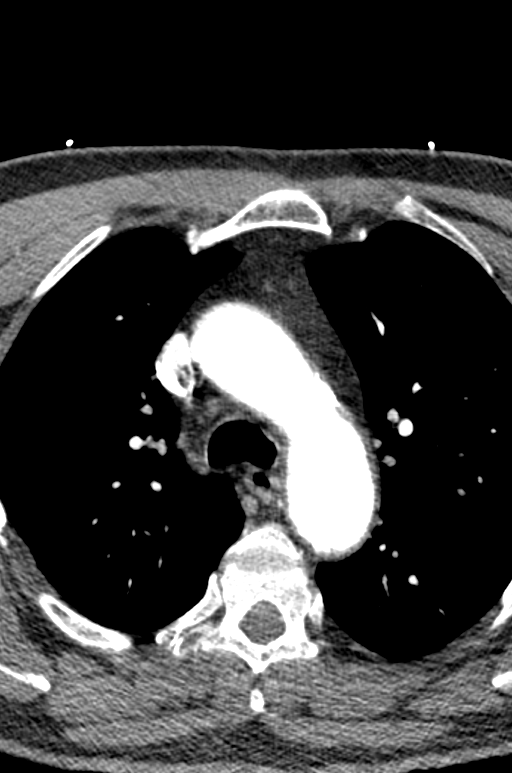
[im 81/282  bone]
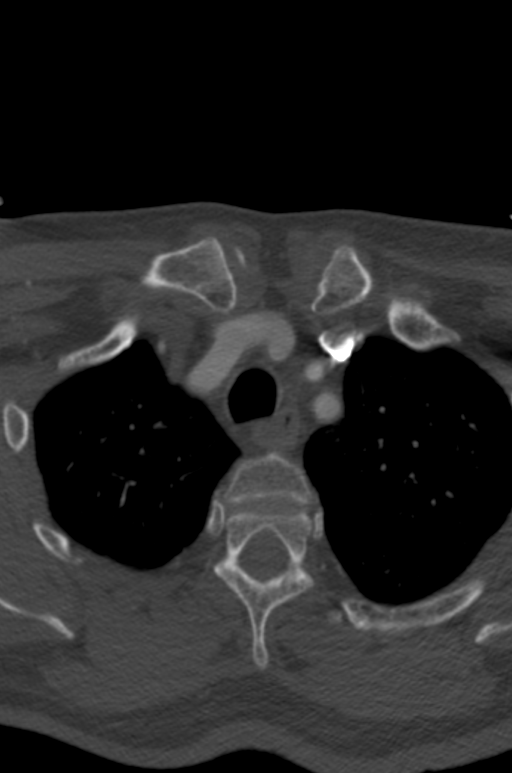
[im 121/282  soft-tissue]
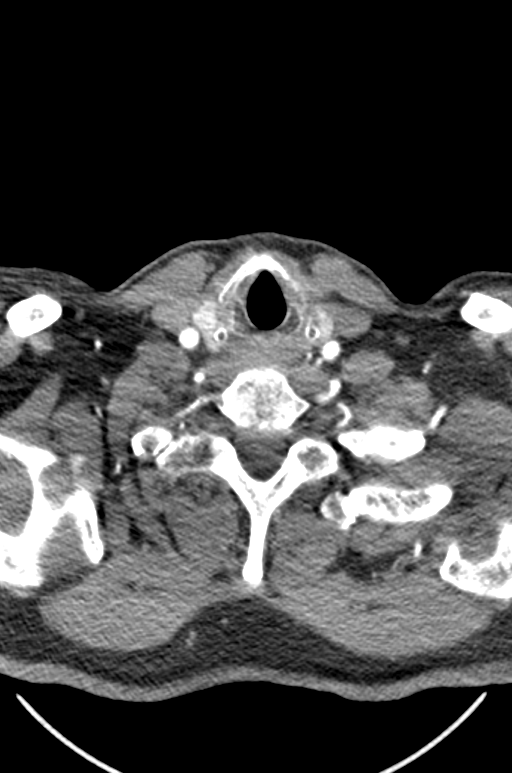
[im 161/282  bone]
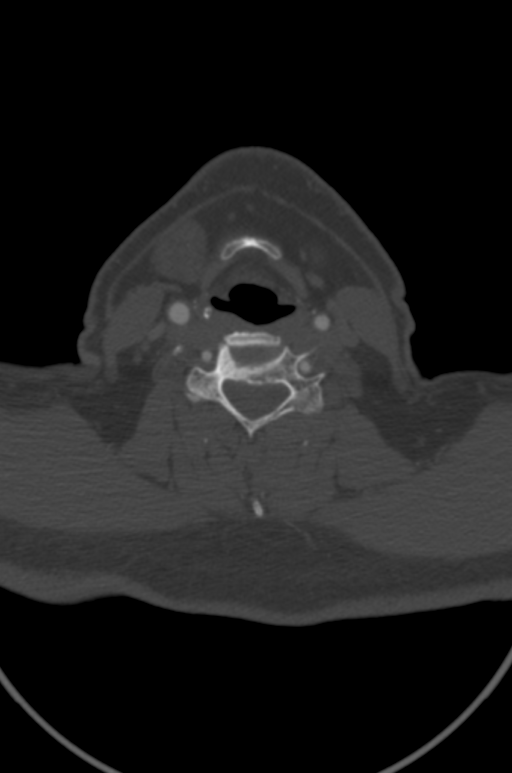
[im 201/282  soft-tissue]
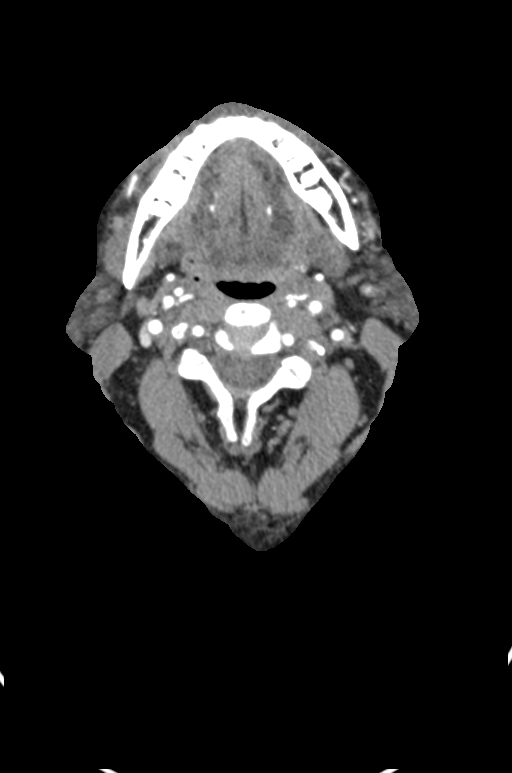
[im 241/282  bone]
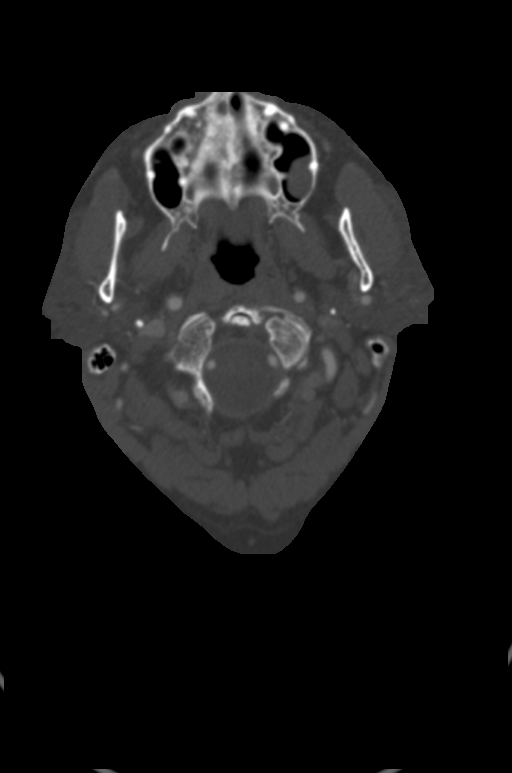

[8 of 33 positions shown; findings below may reference images not displayed]

FINDINGS: Aortic arch: Atherosclerotic plaque. No acute finding or dilatation.
Three vessel branching.

Right carotid system: Moderate predominately calcified atheromatous
plaque at the common carotid bifurcation and ICA bulb. No flow
limiting stenosis or ulceration. Negative for beading. The right ICA
is larger than the left in the setting of aplastic left A1 segment.

Left carotid system: Atheromatous wall thickening of the distal
common carotid artery and proximal ICA. No flow limiting stenosis or
ulceration. Negative for dissection or beading.

Vertebral arteries: Proximal subclavian atheromatous changes.
Codominant vertebral artery. Both vessels are smooth and widely
patent to the dura. Incidental proximal basilar fenestration.

Skeleton: 8 mm left ethmoid osteoma.

Other neck: Small bilateral thyroid nodules. No inflammatory or
aggressive process noted.

Upper chest: Coronary atherosclerosis.
IMPRESSION: 1. Cervical carotid atherosclerosis without flow limiting stenosis.
Left ICA is smaller than the right in the setting of left A1
hypoplasia. Less intense flow on previous MRA may have been related
to the left M1 high-grade stenosis.
2. Acute right thalamic infarct by recent MRI. No stenosis or
embolic source identified in the posterior circulation.
3.  Aortic Atherosclerosis ([id number]).  Coronary atherosclerosis.

## 2019-07-17 ENCOUNTER — Encounter: Payer: Self-pay | Admitting: General Surgery

## 2019-07-25 ENCOUNTER — Ambulatory Visit (INDEPENDENT_AMBULATORY_CARE_PROVIDER_SITE_OTHER): Payer: PPO | Admitting: Family Medicine

## 2019-07-25 ENCOUNTER — Other Ambulatory Visit: Payer: Self-pay

## 2019-07-25 ENCOUNTER — Encounter: Payer: Self-pay | Admitting: Family Medicine

## 2019-07-25 VITALS — BP 167/98 | HR 69 | Temp 98.6°F

## 2019-07-25 DIAGNOSIS — E1149 Type 2 diabetes mellitus with other diabetic neurological complication: Secondary | ICD-10-CM | POA: Diagnosis not present

## 2019-07-25 DIAGNOSIS — E782 Mixed hyperlipidemia: Secondary | ICD-10-CM | POA: Diagnosis not present

## 2019-07-25 DIAGNOSIS — E1159 Type 2 diabetes mellitus with other circulatory complications: Secondary | ICD-10-CM | POA: Diagnosis not present

## 2019-07-25 DIAGNOSIS — R600 Localized edema: Secondary | ICD-10-CM

## 2019-07-25 DIAGNOSIS — I10 Essential (primary) hypertension: Secondary | ICD-10-CM

## 2019-07-25 DIAGNOSIS — Z8673 Personal history of transient ischemic attack (TIA), and cerebral infarction without residual deficits: Secondary | ICD-10-CM | POA: Diagnosis not present

## 2019-07-25 MED ORDER — LOSARTAN POTASSIUM-HCTZ 50-12.5 MG PO TABS: 1.0000 | ORAL_TABLET | Freq: Every day | ORAL | 0 refills | Status: DC

## 2019-07-25 NOTE — Assessment & Plan Note (Signed)
Recheck A1C, historically under poor control and resistant to adding medicines. Continue working on lifestyle components, agreeable to increasing glipizide and possibly adding SGLT-2 inhibitor if still not to goal.

## 2019-07-25 NOTE — Assessment & Plan Note (Signed)
Recheck lipids, adjust as needed. Continue current regimen 

## 2019-07-25 NOTE — Progress Notes (Signed)
BP (!) 167/98   Pulse 69   Temp 98.6 F (37 C)   SpO2 98%    Subjective:    Patient ID: Richard Russian Sr., male    DOB: [birth year], 68 y.o.   MRN: [id number]  HPI: Richard SUBRAMANIAM Sr. is a 68 y.o. male  Chief Complaint  Patient presents with  . Edema    Patient states that he did not have the swelling until his meds was changed   HTN - Checked his BP about a month ago at home and it was 148/82. Does not check regularly. Taking his medicine faithfully without side effects other than the swelling as noted below which he attributes to amlodipine. Denies CP, SOB, HAs, dizziness.   Patient here today concerned about b/l lower leg swelling that seems to have started up after increasing the amlodipine to 10 mg. Typically starts bothering him after lunch each day and by end of day very bothersome and limiting his activities. Denies redness, point tenderness, CP, SOB, palpitations. Has not been trying anything OTC for sxs.   DM - Currently on metformin and glipizide, trying to work on diet and staying active. Very concerned about adding more medicine on so trying to manage with lifestyle from here but has had A1C's in the 8s for over a year now. Denies low blood sugar spells. Refuses any sort of injectable medication, especially insulin. Taking gabapentin for neuropathy, 1 in AM and 2 at bedtime which he feels helps.   HLD - on crestor without noted side effects.   Hx of CVA - no new issues, on baby aspirin and trying to improve risk factors with BP, chol, and DM control as well as lifestyle factors.   Relevant past medical, surgical, family and social history reviewed and updated as indicated. Interim medical history since our last visit reviewed. Allergies and medications reviewed and updated.  Review of Systems  Per HPI unless specifically indicated above     Objective:    BP (!) 167/98   Pulse 69   Temp 98.6 F (37 C)   SpO2 98%   Wt Readings from Last 3 Encounters:  01/15/19  208 lb (94.3 kg)  12/06/18 207 lb 14.4 oz (94.3 kg)  08/30/17 192 lb (87.1 kg)    Physical Exam Vitals signs and nursing note reviewed.  Constitutional:      Appearance: Normal appearance.  HENT:     Head: Atraumatic.  Eyes:     Extraocular Movements: Extraocular movements intact.     Conjunctiva/sclera: Conjunctivae normal.  Neck:     Musculoskeletal: Normal range of motion and neck supple.  Cardiovascular:     Rate and Rhythm: Normal rate and regular rhythm.  Pulmonary:     Effort: Pulmonary effort is normal.     Breath sounds: Normal breath sounds.  Musculoskeletal: Normal range of motion.  Skin:    General: Skin is warm and dry.  Neurological:     General: No focal deficit present.     Mental Status: He is oriented to person, place, and time.  Psychiatric:        Mood and Affect: Mood normal.        Thought Content: Thought content normal.        Judgment: Judgment normal.     Results for orders placed or performed in visit on 03/13/19  HgB A1c  Result Value Ref Range   Hgb A1c MFr Bld 8.7 (H) 4.8 - 5.6 %  Est. average glucose Bld gHb Est-mCnc 203 mg/dL  Comprehensive metabolic panel  Result Value Ref Range   Glucose 161 (H) 65 - 99 mg/dL   BUN 12 8 - 27 mg/dL   Creatinine, Ser 1.09 0.76 - 1.27 mg/dL   GFR calc non Af Amer 69 >59 mL/min/1.73   GFR calc Af Amer 80 >59 mL/min/1.73   BUN/Creatinine Ratio 11 10 - 24   Sodium 139 134 - 144 mmol/L   Potassium 5.0 3.5 - 5.2 mmol/L   Chloride 100 96 - 106 mmol/L   CO2 24 20 - 29 mmol/L   Calcium 9.3 8.6 - 10.2 mg/dL   Total Protein 7.2 6.0 - 8.5 g/dL   Albumin 4.7 3.8 - 4.8 g/dL   Globulin, Total 2.5 1.5 - 4.5 g/dL   Albumin/Globulin Ratio 1.9 1.2 - 2.2   Bilirubin Total 0.5 0.0 - 1.2 mg/dL   Alkaline Phosphatase 87 39 - 117 IU/L   AST 19 0 - 40 IU/L   ALT 23 0 - 44 IU/L      Assessment & Plan:   Problem List Items Addressed This Visit      Cardiovascular and Mediastinum   Hypertension associated with  diabetes ([place])    Above goal, and having swelling which could be caused by the almodipine. D/c amlodipine, add losartan HCTZ and continue metoprolol. Monitor home readings closely, recheck in 1 month      Relevant Medications   losartan-hydrochlorothiazide (HYZAAR) 50-12.5 MG tablet   Other Relevant Orders   Comprehensive metabolic panel     Endocrine   Type 2 diabetes mellitus with neurological complications ([facility]) - Primary    Recheck A1C, historically under poor control and resistant to adding medicines. Continue working on lifestyle components, agreeable to increasing glipizide and possibly adding SGLT-2 inhibitor if still not to goal.       Relevant Medications   losartan-hydrochlorothiazide (HYZAAR) 50-12.5 MG tablet   Other Relevant Orders   HgB A1c     Other   Hyperlipidemia    Recheck lipids, adjust as needed. Continue current regimen      Relevant Medications   losartan-hydrochlorothiazide (HYZAAR) 50-12.5 MG tablet   Other Relevant Orders   Lipid Panel w/o Chol/HDL Ratio   History of CVA (cerebrovascular accident)    With multiple poorly controlled chronic conditions at this time. WIll continue to work toward improved control, lifestyle modifications. Continue current regimen       Other Visit Diagnoses    Bilateral leg edema       Likely from amlodipine, d/c for losartan HCTZ. Compression hose, salt restriction, leg elevation reviewed. F/u if not resolving       Follow up plan: Return in about 4 weeks (around 08/22/2019) for BP.

## 2019-07-25 NOTE — Assessment & Plan Note (Signed)
With multiple poorly controlled chronic conditions at this time. WIll continue to work toward improved control, lifestyle modifications. Continue current regimen

## 2019-07-25 NOTE — Assessment & Plan Note (Signed)
Above goal, and having swelling which could be caused by the almodipine. D/c amlodipine, add losartan HCTZ and continue metoprolol. Monitor home readings closely, recheck in 1 month

## 2019-07-26 LAB — COMPREHENSIVE METABOLIC PANEL
ALT: 24 IU/L (ref 0–44)
AST: 19 IU/L (ref 0–40)
Albumin/Globulin Ratio: 2 (ref 1.2–2.2)
Albumin: 4.7 g/dL (ref 3.8–4.8)
Alkaline Phosphatase: 76 IU/L (ref 39–117)
BUN/Creatinine Ratio: 15 (ref 10–24)
BUN: 16 mg/dL (ref 8–27)
Bilirubin Total: 0.5 mg/dL (ref 0.0–1.2)
CO2: 23 mmol/L (ref 20–29)
Calcium: 9.6 mg/dL (ref 8.6–10.2)
Chloride: 99 mmol/L (ref 96–106)
Creatinine, Ser: 1.09 mg/dL (ref 0.76–1.27)
GFR calc Af Amer: 79 mL/min/{1.73_m2} (ref >59)
GFR calc non Af Amer: 68 mL/min/{1.73_m2} (ref >59)
Globulin, Total: 2.4 g/dL (ref 1.5–4.5)
Glucose: 183 mg/dL — ABNORMAL HIGH (ref 65–99)
Potassium: 4.6 mmol/L (ref 3.5–5.2)
Sodium: 138 mmol/L (ref 134–144)
Total Protein: 7.1 g/dL (ref 6.0–8.5)

## 2019-07-26 LAB — LIPID PANEL W/O CHOL/HDL RATIO
Cholesterol, Total: 111 mg/dL (ref 100–199)
HDL: 38 mg/dL — ABNORMAL LOW (ref >39)
LDL Calculated: 54 mg/dL (ref 0–99)
Triglycerides: 96 mg/dL (ref 0–149)
VLDL Cholesterol Cal: 19 mg/dL (ref 5–40)

## 2019-07-26 LAB — HEMOGLOBIN A1C
Est. average glucose Bld gHb Est-mCnc: 206 mg/dL
Hgb A1c MFr Bld: 8.8 % — ABNORMAL HIGH (ref 4.8–5.6)

## 2019-07-29 ENCOUNTER — Telehealth: Payer: Self-pay | Admitting: Family Medicine

## 2019-07-29 MED ORDER — GLIPIZIDE ER 10 MG PO TB24: 10.0000 mg | ORAL_TABLET | Freq: Every day | ORAL | 0 refills | Status: DC

## 2019-07-29 MED ORDER — DAPAGLIFLOZIN PROPANEDIOL 5 MG PO TABS: 5.0000 mg | ORAL_TABLET | Freq: Every day | ORAL | 0 refills | Status: DC

## 2019-07-29 NOTE — Telephone Encounter (Signed)
Called and left VM to call back, wanting to discuss lab results. A1C continues to increase. Wanting to increase his glipizide to 10 mg daily and start farxiga. I'd like to see him back after 3 months to recheck things, sooner if he's having any issues with his medicines. Encourage good diet and exercise habits as well

## 2019-07-30 NOTE — Telephone Encounter (Signed)
Patient notified and verbalized understanding. 

## 2019-07-31 ENCOUNTER — Telehealth: Payer: Self-pay

## 2019-07-31 NOTE — Telephone Encounter (Signed)
PA for Farxiga initiated and submitted via Cover My Meds. Key: [id number]

## 2019-08-06 NOTE — Telephone Encounter (Signed)
PA approved.

## 2019-08-11 ENCOUNTER — Other Ambulatory Visit: Payer: Self-pay | Admitting: Family Medicine

## 2019-08-21 ENCOUNTER — Encounter: Payer: Self-pay | Admitting: Family Medicine

## 2019-08-21 NOTE — Telephone Encounter (Signed)
Needs appt, have never written this for him before

## 2019-08-22 ENCOUNTER — Other Ambulatory Visit: Payer: Self-pay | Admitting: Family Medicine

## 2019-08-27 ENCOUNTER — Other Ambulatory Visit: Payer: Self-pay

## 2019-08-27 ENCOUNTER — Ambulatory Visit (INDEPENDENT_AMBULATORY_CARE_PROVIDER_SITE_OTHER): Payer: PPO | Admitting: Family Medicine

## 2019-08-27 ENCOUNTER — Encounter: Payer: Self-pay | Admitting: Family Medicine

## 2019-08-27 VITALS — BP 182/77 | HR 66 | Temp 98.6°F | Ht 70.0 in | Wt 200.0 lb

## 2019-08-27 DIAGNOSIS — I10 Essential (primary) hypertension: Secondary | ICD-10-CM

## 2019-08-27 DIAGNOSIS — T753XXA Motion sickness, initial encounter: Secondary | ICD-10-CM | POA: Diagnosis not present

## 2019-08-27 DIAGNOSIS — E1159 Type 2 diabetes mellitus with other circulatory complications: Secondary | ICD-10-CM | POA: Diagnosis not present

## 2019-08-27 MED ORDER — SCOPOLAMINE 1 MG/3DAYS TD PT72: 1.0000 | MEDICATED_PATCH | TRANSDERMAL | 0 refills | Status: DC

## 2019-08-27 MED ORDER — LOSARTAN POTASSIUM 50 MG PO TABS: ORAL_TABLET | ORAL | 0 refills | Status: DC

## 2019-08-27 MED ORDER — HYDROCHLOROTHIAZIDE 25 MG PO TABS: 25.0000 mg | ORAL_TABLET | Freq: Every day | ORAL | 0 refills | Status: DC

## 2019-08-27 NOTE — Progress Notes (Signed)
BP (!) 182/77 (BP Location: Left Arm, Patient Position: Sitting, Cuff Size: Normal)   Pulse 66   Temp 98.6 F (37 C) (Oral)   Ht 5\' 10"  (1.778 m)   Wt 200 lb (90.7 kg)   SpO2 96%   BMI 28.70 kg/m    Subjective:    Patient ID: Richard Russian Sr., male    DOB: [birth year], 68 y.o.   MRN: [id number]  HPI: Richard DELROSARIO Sr. is a 68 y.o. male  Chief Complaint  Patient presents with  . Hypertension   Patient here today for HTN recheck. Not checking home BPs. Taking his medicines faithfully without side effects. Denies CP, SOB, HAs dizziness.   About to go deep sea fishing and hoping to get sea sickness patches for this as he's had issues in the past.   Relevant past medical, surgical, family and social history reviewed and updated as indicated. Interim medical history since our last visit reviewed. Allergies and medications reviewed and updated.  Review of Systems  Per HPI unless specifically indicated above     Objective:    BP (!) 182/77 (BP Location: Left Arm, Patient Position: Sitting, Cuff Size: Normal)   Pulse 66   Temp 98.6 F (37 C) (Oral)   Ht 5\' 10"  (1.778 m)   Wt 200 lb (90.7 kg)   SpO2 96%   BMI 28.70 kg/m   Wt Readings from Last 3 Encounters:  08/27/19 200 lb (90.7 kg)  01/15/19 208 lb (94.3 kg)  12/06/18 207 lb 14.4 oz (94.3 kg)    Physical Exam Vitals signs and nursing note reviewed.  Constitutional:      Appearance: Normal appearance.  HENT:     Head: Atraumatic.  Eyes:     Extraocular Movements: Extraocular movements intact.     Conjunctiva/sclera: Conjunctivae normal.  Neck:     Musculoskeletal: Normal range of motion and neck supple.  Cardiovascular:     Rate and Rhythm: Normal rate and regular rhythm.  Pulmonary:     Effort: Pulmonary effort is normal.     Breath sounds: Normal breath sounds.  Musculoskeletal: Normal range of motion.  Skin:    General: Skin is warm and dry.  Neurological:     General: No focal deficit present.   Mental Status: He is oriented to person, place, and time.  Psychiatric:        Mood and Affect: Mood normal.        Thought Content: Thought content normal.        Judgment: Judgment normal.     Results for orders placed or performed in visit on 07/25/19  Lipid Panel w/o Chol/HDL Ratio  Result Value Ref Range   Cholesterol, Total 111 100 - 199 mg/dL   Triglycerides 96 0 - 149 mg/dL   HDL 38 (L) >39 mg/dL   VLDL Cholesterol Cal 19 5 - 40 mg/dL   LDL Calculated 54 0 - 99 mg/dL  Comprehensive metabolic panel  Result Value Ref Range   Glucose 183 (H) 65 - 99 mg/dL   BUN 16 8 - 27 mg/dL   Creatinine, Ser 1.09 0.76 - 1.27 mg/dL   GFR calc non Af Amer 68 >59 mL/min/1.73   GFR calc Af Amer 79 >59 mL/min/1.73   BUN/Creatinine Ratio 15 10 - 24   Sodium 138 134 - 144 mmol/L   Potassium 4.6 3.5 - 5.2 mmol/L   Chloride 99 96 - 106 mmol/L   CO2 23 20 - 29 mmol/L  Calcium 9.6 8.6 - 10.2 mg/dL   Total Protein 7.1 6.0 - 8.5 g/dL   Albumin 4.7 3.8 - 4.8 g/dL   Globulin, Total 2.4 1.5 - 4.5 g/dL   Albumin/Globulin Ratio 2.0 1.2 - 2.2   Bilirubin Total 0.5 0.0 - 1.2 mg/dL   Alkaline Phosphatase 76 39 - 117 IU/L   AST 19 0 - 40 IU/L   ALT 24 0 - 44 IU/L  HgB A1c  Result Value Ref Range   Hgb A1c MFr Bld 8.8 (H) 4.8 - 5.6 %   Est. average glucose Bld gHb Est-mCnc 206 mg/dL      Assessment & Plan:   Problem List Items Addressed This Visit      Cardiovascular and Mediastinum   Hypertension associated with diabetes ([place]) - Primary    Not at goal, continue current regimen but increase HCTZ to 25 mg daily. Start checking home readings, will recheck in office in 1 month. DASH diet, exercise reviewed      Relevant Medications   hydrochlorothiazide (HYDRODIURIL) 25 MG tablet   losartan (COZAAR) 50 MG tablet    Other Visit Diagnoses    Sea sickness, initial encounter       Transderm patches sent.        Follow up plan: Return in about 4 weeks (around 09/24/2019) for BP.

## 2019-09-01 ENCOUNTER — Other Ambulatory Visit: Payer: Self-pay

## 2019-09-01 MED ORDER — GABAPENTIN 300 MG PO CAPS: 300.0000 mg | ORAL_CAPSULE | Freq: Three times a day (TID) | ORAL | 0 refills | Status: DC | PRN

## 2019-09-01 NOTE — Assessment & Plan Note (Signed)
Not at goal, continue current regimen but increase HCTZ to 25 mg daily. Start checking home readings, will recheck in office in 1 month. DASH diet, exercise reviewed

## 2019-09-26 ENCOUNTER — Ambulatory Visit: Payer: PPO | Admitting: Family Medicine

## 2019-09-29 ENCOUNTER — Other Ambulatory Visit: Payer: Self-pay

## 2019-09-29 ENCOUNTER — Ambulatory Visit (INDEPENDENT_AMBULATORY_CARE_PROVIDER_SITE_OTHER): Payer: PPO | Admitting: Family Medicine

## 2019-09-29 ENCOUNTER — Encounter: Payer: Self-pay | Admitting: Family Medicine

## 2019-09-29 VITALS — BP 168/86 | HR 66 | Temp 98.6°F

## 2019-09-29 DIAGNOSIS — E1159 Type 2 diabetes mellitus with other circulatory complications: Secondary | ICD-10-CM | POA: Diagnosis not present

## 2019-09-29 DIAGNOSIS — I10 Essential (primary) hypertension: Secondary | ICD-10-CM | POA: Diagnosis not present

## 2019-09-29 MED ORDER — LOSARTAN POTASSIUM 100 MG PO TABS: 100.0000 mg | ORAL_TABLET | Freq: Every day | ORAL | 0 refills | Status: DC

## 2019-09-29 NOTE — Assessment & Plan Note (Signed)
BPs improved but still above goal. Increase losartan to 100 mg and continue remainder of regimen as is. Continue DASH diet and exercise. Record home readings, call with persistent abnormal readings.

## 2019-09-29 NOTE — Progress Notes (Signed)
BP (!) 168/86   Pulse 66   Temp 98.6 F (37 C)   SpO2 97%    Subjective:    Patient ID: Richard Russian Sr., male    DOB: 1951/09/25, 68 y.o.   MRN: [id number]  HPI: Richard ROUTSON Sr. is a 68 y.o. male  Chief Complaint  Patient presents with  . Hypertension    Patient has taken his BP a couple times it has been 140's / 70's 80's   Here today for 1 month BP f/u after increasing HCTZ to 25 mg. Home BPs the few times he's checked have been 140s/70s-80s. Tolerating medicines well and taking faithfully. Denies CP, SOB, HAs, dizziness. Has been trying to follow DASH diet and staying active.   Relevant past medical, surgical, family and social history reviewed and updated as indicated. Interim medical history since our last visit reviewed. Allergies and medications reviewed and updated.  Review of Systems  Per HPI unless specifically indicated above     Objective:    BP (!) 168/86   Pulse 66   Temp 98.6 F (37 C)   SpO2 97%   Wt Readings from Last 3 Encounters:  08/27/19 200 lb (90.7 kg)  01/15/19 208 lb (94.3 kg)  12/06/18 207 lb 14.4 oz (94.3 kg)    Physical Exam Vitals signs and nursing note reviewed.  Constitutional:      Appearance: Normal appearance.  HENT:     Head: Atraumatic.  Eyes:     Extraocular Movements: Extraocular movements intact.     Conjunctiva/sclera: Conjunctivae normal.  Neck:     Musculoskeletal: Normal range of motion and neck supple.  Cardiovascular:     Rate and Rhythm: Normal rate and regular rhythm.  Pulmonary:     Effort: Pulmonary effort is normal.     Breath sounds: Normal breath sounds.  Musculoskeletal: Normal range of motion.  Skin:    General: Skin is warm and dry.  Neurological:     General: No focal deficit present.     Mental Status: He is oriented to person, place, and time.  Psychiatric:        Mood and Affect: Mood normal.        Thought Content: Thought content normal.        Judgment: Judgment normal.     Results  for orders placed or performed in visit on 07/25/19  Lipid Panel w/o Chol/HDL Ratio  Result Value Ref Range   Cholesterol, Total 111 100 - 199 mg/dL   Triglycerides 96 0 - 149 mg/dL   HDL 38 (L) >39 mg/dL   VLDL Cholesterol Cal 19 5 - 40 mg/dL   LDL Calculated 54 0 - 99 mg/dL  Comprehensive metabolic panel  Result Value Ref Range   Glucose 183 (H) 65 - 99 mg/dL   BUN 16 8 - 27 mg/dL   Creatinine, Ser 1.09 0.76 - 1.27 mg/dL   GFR calc non Af Amer 68 >59 mL/min/1.73   GFR calc Af Amer 79 >59 mL/min/1.73   BUN/Creatinine Ratio 15 10 - 24   Sodium 138 134 - 144 mmol/L   Potassium 4.6 3.5 - 5.2 mmol/L   Chloride 99 96 - 106 mmol/L   CO2 23 20 - 29 mmol/L   Calcium 9.6 8.6 - 10.2 mg/dL   Total Protein 7.1 6.0 - 8.5 g/dL   Albumin 4.7 3.8 - 4.8 g/dL   Globulin, Total 2.4 1.5 - 4.5 g/dL   Albumin/Globulin Ratio 2.0 1.2 -  2.2   Bilirubin Total 0.5 0.0 - 1.2 mg/dL   Alkaline Phosphatase 76 39 - 117 IU/L   AST 19 0 - 40 IU/L   ALT 24 0 - 44 IU/L  HgB A1c  Result Value Ref Range   Hgb A1c MFr Bld 8.8 (H) 4.8 - 5.6 %   Est. average glucose Bld gHb Est-mCnc 206 mg/dL      Assessment & Plan:   Problem List Items Addressed This Visit      Cardiovascular and Mediastinum   Hypertension associated with diabetes ([place]) - Primary    BPs improved but still above goal. Increase losartan to 100 mg and continue remainder of regimen as is. Continue DASH diet and exercise. Record home readings, call with persistent abnormal readings.       Relevant Medications   amLODipine (NORVASC) 10 MG tablet   losartan (COZAAR) 100 MG tablet       Follow up plan: Return in about 2 months (around 11/29/2019) for BP, DM.

## 2019-10-24 ENCOUNTER — Other Ambulatory Visit: Payer: Self-pay | Admitting: Family Medicine

## 2019-11-21 ENCOUNTER — Other Ambulatory Visit: Payer: Self-pay | Admitting: Family Medicine

## 2019-12-01 ENCOUNTER — Ambulatory Visit (INDEPENDENT_AMBULATORY_CARE_PROVIDER_SITE_OTHER): Payer: PPO | Admitting: Family Medicine

## 2019-12-01 ENCOUNTER — Other Ambulatory Visit: Payer: Self-pay

## 2019-12-01 ENCOUNTER — Encounter: Payer: Self-pay | Admitting: Family Medicine

## 2019-12-01 VITALS — BP 138/78 | HR 76 | Temp 98.7°F | Ht 70.0 in

## 2019-12-01 DIAGNOSIS — E1149 Type 2 diabetes mellitus with other diabetic neurological complication: Secondary | ICD-10-CM

## 2019-12-01 DIAGNOSIS — I10 Essential (primary) hypertension: Secondary | ICD-10-CM | POA: Diagnosis not present

## 2019-12-01 DIAGNOSIS — E1159 Type 2 diabetes mellitus with other circulatory complications: Secondary | ICD-10-CM | POA: Diagnosis not present

## 2019-12-01 MED ORDER — GLIPIZIDE ER 10 MG PO TB24: 10.0000 mg | ORAL_TABLET | Freq: Every day | ORAL | 1 refills | Status: DC

## 2019-12-01 MED ORDER — ROSUVASTATIN CALCIUM 20 MG PO TABS: 20.0000 mg | ORAL_TABLET | Freq: Every day | ORAL | 1 refills | Status: DC

## 2019-12-01 MED ORDER — HYDROCHLOROTHIAZIDE 25 MG PO TABS: 25.0000 mg | ORAL_TABLET | Freq: Every day | ORAL | 1 refills | Status: DC

## 2019-12-01 MED ORDER — OMEPRAZOLE 20 MG PO CPDR: 20.0000 mg | DELAYED_RELEASE_CAPSULE | Freq: Every day | ORAL | 1 refills | Status: DC

## 2019-12-01 MED ORDER — METOPROLOL SUCCINATE ER 25 MG PO TB24: 25.0000 mg | ORAL_TABLET | Freq: Every day | ORAL | 3 refills | Status: DC

## 2019-12-01 MED ORDER — AMLODIPINE BESYLATE 10 MG PO TABS: 10.0000 mg | ORAL_TABLET | Freq: Every day | ORAL | 1 refills | Status: DC

## 2019-12-01 MED ORDER — METFORMIN HCL 1000 MG PO TABS: 1000.0000 mg | ORAL_TABLET | Freq: Two times a day (BID) | ORAL | 1 refills | Status: DC

## 2019-12-01 MED ORDER — GABAPENTIN 300 MG PO CAPS: 300.0000 mg | ORAL_CAPSULE | Freq: Three times a day (TID) | ORAL | 1 refills | Status: DC | PRN

## 2019-12-01 MED ORDER — LOSARTAN POTASSIUM 100 MG PO TABS: 100.0000 mg | ORAL_TABLET | Freq: Every day | ORAL | 1 refills | Status: DC

## 2019-12-01 NOTE — Assessment & Plan Note (Signed)
BPs stable and WNL, continue current regimen 

## 2019-12-01 NOTE — Progress Notes (Signed)
BP 138/78   Pulse 76   Temp 98.7 F (37.1 C) (Oral)   Ht 5\' 10"  (1.778 m)   BMI 28.70 kg/m    Subjective:    Patient ID: Richard Russian Sr., male    DOB: [birth year], 68 y.o.   MRN: [id number]  HPI: [name] is a 68 y.o. male  Chief Complaint  Patient presents with  . Hypertension  . Diabetes    . This visit was completed via WebEx due to the restrictions of the COVID-19 pandemic. All issues as above were discussed and addressed. Physical exam was done as above through visual confirmation on WebEx. If it was felt that the patient should be evaluated in the office, they were directed there. The patient verbally consented to this visit. . Location of the patient: home . Location of the provider: work . Those involved with this call:  . Provider: Merrie Roof, PA-C . CMA: Lesle Chris, [place] . Front Desk/Registration: Jill Side  . Time spent on call: 15 minutes with patient face to face via video conference. More than 50% of this time was spent in counseling and coordination of care. 5 minutes total spent in review of patient's record and preparation of their chart. I verified patient identity using two factors (patient name and date of birth). Patient consents verbally to being seen via telemedicine visit today.   Here today for HTN and DM f/u. Home BPs have been running 130s/80s typically. Sometimes feeling fatigued but still able to do his day to day tasks. Denies CP, SOB, HAs, dizziness and tolerating the increased losartan well.   BSs 140s-150s when checked. Has taken medicines faithfully without low blood sugar spells. Trying to eat well and stay active.   Relevant past medical, surgical, family and social history reviewed and updated as indicated. Interim medical history since our last visit reviewed. Allergies and medications reviewed and updated.  Review of Systems  Per HPI unless specifically indicated above     Objective:    BP 138/78   Pulse 76   Temp  98.7 F (37.1 C) (Oral)   Ht 5\' 10"  (1.778 m)   BMI 28.70 kg/m   Wt Readings from Last 3 Encounters:  08/27/19 200 lb (90.7 kg)  01/15/19 208 lb (94.3 kg)  12/06/18 207 lb 14.4 oz (94.3 kg)    Physical Exam Vitals and nursing note reviewed.  Constitutional:      General: He is not in acute distress.    Appearance: Normal appearance.  HENT:     Head: Atraumatic.     Right Ear: External ear normal.     Left Ear: External ear normal.     Nose: Nose normal. No congestion.     Mouth/Throat:     Mouth: Mucous membranes are moist.     Pharynx: Oropharynx is clear.  Eyes:     Extraocular Movements: Extraocular movements intact.     Conjunctiva/sclera: Conjunctivae normal.  Pulmonary:     Effort: Pulmonary effort is normal. No respiratory distress.  Musculoskeletal:        General: Normal range of motion.     Cervical back: Normal range of motion.  Skin:    General: Skin is dry.     Findings: No erythema or rash.  Neurological:     Mental Status: He is oriented to person, place, and time.  Psychiatric:        Mood and Affect: Mood normal.  Thought Content: Thought content normal.        Judgment: Judgment normal.     Results for orders placed or performed in visit on 07/25/19  Lipid Panel w/o Chol/HDL Ratio  Result Value Ref Range   Cholesterol, Total 111 100 - 199 mg/dL   Triglycerides 96 0 - 149 mg/dL   HDL 38 (L) >39 mg/dL   VLDL Cholesterol Cal 19 5 - 40 mg/dL   LDL Calculated 54 0 - 99 mg/dL  Comprehensive metabolic panel  Result Value Ref Range   Glucose 183 (H) 65 - 99 mg/dL   BUN 16 8 - 27 mg/dL   Creatinine, Ser 1.09 0.76 - 1.27 mg/dL   GFR calc non Af Amer 68 >59 mL/min/1.73   GFR calc Af Amer 79 >59 mL/min/1.73   BUN/Creatinine Ratio 15 10 - 24   Sodium 138 134 - 144 mmol/L   Potassium 4.6 3.5 - 5.2 mmol/L   Chloride 99 96 - 106 mmol/L   CO2 23 20 - 29 mmol/L   Calcium 9.6 8.6 - 10.2 mg/dL   Total Protein 7.1 6.0 - 8.5 g/dL   Albumin 4.7 3.8 -  4.8 g/dL   Globulin, Total 2.4 1.5 - 4.5 g/dL   Albumin/Globulin Ratio 2.0 1.2 - 2.2   Bilirubin Total 0.5 0.0 - 1.2 mg/dL   Alkaline Phosphatase 76 39 - 117 IU/L   AST 19 0 - 40 IU/L   ALT 24 0 - 44 IU/L  HgB A1c  Result Value Ref Range   Hgb A1c MFr Bld 8.8 (H) 4.8 - 5.6 %   Est. average glucose Bld gHb Est-mCnc 206 mg/dL      Assessment & Plan:   Problem List Items Addressed This Visit      Cardiovascular and Mediastinum   Hypertension associated with diabetes ([facility])    BPs stable and WNL, continue current regimen      Relevant Medications   amLODipine (NORVASC) 10 MG tablet   glipiZIDE (GLUCOTROL XL) 10 MG 24 hr tablet   hydrochlorothiazide (HYDRODIURIL) 25 MG tablet   losartan (COZAAR) 100 MG tablet   metFORMIN (GLUCOPHAGE) 1000 MG tablet   metoprolol succinate (TOPROL-XL) 25 MG 24 hr tablet   rosuvastatin (CRESTOR) 20 MG tablet     Endocrine   Type 2 diabetes mellitus with neurological complications ([facility]) - Primary    Recheck A1C, adjust as needed. Continue working on diet and exercise changes.       Relevant Medications   glipiZIDE (GLUCOTROL XL) 10 MG 24 hr tablet   losartan (COZAAR) 100 MG tablet   metFORMIN (GLUCOPHAGE) 1000 MG tablet   rosuvastatin (CRESTOR) 20 MG tablet   Other Relevant Orders   HgB A1c       Follow up plan: Return in about 3 months (around 02/29/2020) for 6 month f/u.

## 2019-12-01 NOTE — Assessment & Plan Note (Signed)
Recheck A1C, adjust as needed. Continue working on diet and exercise changes 

## 2020-03-10 ENCOUNTER — Telehealth: Payer: Self-pay

## 2020-03-10 NOTE — Telephone Encounter (Signed)
Call to patient to notify him that he is due for his 5 year colonoscopy follow up. He states that he will be returning to Dr Bary Castilla for this exam.

## 2020-03-17 ENCOUNTER — Ambulatory Visit (INDEPENDENT_AMBULATORY_CARE_PROVIDER_SITE_OTHER): Payer: PPO

## 2020-03-17 ENCOUNTER — Other Ambulatory Visit: Payer: Self-pay

## 2020-03-17 ENCOUNTER — Telehealth: Payer: Self-pay | Admitting: Family Medicine

## 2020-03-17 ENCOUNTER — Ambulatory Visit (INDEPENDENT_AMBULATORY_CARE_PROVIDER_SITE_OTHER): Payer: PPO | Admitting: Family Medicine

## 2020-03-17 ENCOUNTER — Encounter: Payer: Self-pay | Admitting: Family Medicine

## 2020-03-17 VITALS — BP 158/93 | HR 59 | Temp 97.9°F | Ht 70.0 in | Wt 208.0 lb

## 2020-03-17 VITALS — BP 158/93 | HR 59 | Temp 97.9°F | Wt 208.0 lb

## 2020-03-17 DIAGNOSIS — R110 Nausea: Secondary | ICD-10-CM

## 2020-03-17 DIAGNOSIS — E1149 Type 2 diabetes mellitus with other diabetic neurological complication: Secondary | ICD-10-CM | POA: Diagnosis not present

## 2020-03-17 DIAGNOSIS — M79674 Pain in right toe(s): Secondary | ICD-10-CM | POA: Diagnosis not present

## 2020-03-17 DIAGNOSIS — Z8673 Personal history of transient ischemic attack (TIA), and cerebral infarction without residual deficits: Secondary | ICD-10-CM | POA: Diagnosis not present

## 2020-03-17 DIAGNOSIS — E1159 Type 2 diabetes mellitus with other circulatory complications: Secondary | ICD-10-CM | POA: Diagnosis not present

## 2020-03-17 DIAGNOSIS — F419 Anxiety disorder, unspecified: Secondary | ICD-10-CM | POA: Diagnosis not present

## 2020-03-17 DIAGNOSIS — E782 Mixed hyperlipidemia: Secondary | ICD-10-CM | POA: Diagnosis not present

## 2020-03-17 DIAGNOSIS — Z0000 Encounter for general adult medical examination without abnormal findings: Secondary | ICD-10-CM | POA: Diagnosis not present

## 2020-03-17 DIAGNOSIS — Z1159 Encounter for screening for other viral diseases: Secondary | ICD-10-CM

## 2020-03-17 DIAGNOSIS — I10 Essential (primary) hypertension: Secondary | ICD-10-CM | POA: Diagnosis not present

## 2020-03-17 MED ORDER — CLONAZEPAM 0.5 MG PO TABS: 0.5000 mg | ORAL_TABLET | Freq: Every day | ORAL | 0 refills | Status: DC | PRN

## 2020-03-17 MED ORDER — MECLIZINE HCL 25 MG PO TABS: 25.0000 mg | ORAL_TABLET | Freq: Three times a day (TID) | ORAL | 0 refills | Status: AC | PRN

## 2020-03-17 NOTE — Assessment & Plan Note (Signed)
BPs high normal or just above normal. Continue to monitor closely, work on diet and exercise and call with persistent abnormal home readings.

## 2020-03-17 NOTE — Assessment & Plan Note (Signed)
Recheck lipids, adjust as needed 

## 2020-03-17 NOTE — Progress Notes (Signed)
BP (!) 158/93   Pulse (!) 59   Temp 97.9 F (36.6 C) (Oral)   Wt 208 lb (94.3 kg)   SpO2 98%   BMI 29.84 kg/m    Subjective:    Patient ID: Richard Russian Sr., male    DOB: 09-16-1951, 69 y.o.   MRN: [id number]  HPI: Richard ARONOW Sr. is a 69 y.o. male  Chief Complaint  Patient presents with  . Diabetes  . Hypertension   Here today for 6 month f/u chronic conditions.   HTN - home readings running 130s-140/70s. Fell yesterday onto buttocks and is in pain, so thinks that's why his BP is high today. Denies CP, SOB, HAs, dizziness.   Taking gabapentin for left hand pain after his stroke and neuropathy, helping quite a bit.   DM - not checking home BSs. Taking medicines faithfully. Has not been careful with diet but does stay active.   HLD - taking crestor, tolerating well without myalgias, claudication.   Caught his right little toe on the bed frame 1 year ago and still having signifiacnt pulling pain.   Nausea and off balance feelings when laying flat and looking up or moving head around too fast. Gets motion sick in cars at times and during deep sea boat trips and states it feels somewhat similar.   Relevant past medical, surgical, family and social history reviewed and updated as indicated. Interim medical history since our last visit reviewed. Allergies and medications reviewed and updated.  Review of Systems  Per HPI unless specifically indicated above     Objective:    BP (!) 158/93   Pulse (!) 59   Temp 97.9 F (36.6 C) (Oral)   Wt 208 lb (94.3 kg)   SpO2 98%   BMI 29.84 kg/m   Wt Readings from Last 3 Encounters:  03/17/20 208 lb (94.3 kg)  03/17/20 208 lb (94.3 kg)  08/27/19 200 lb (90.7 kg)    Physical Exam Vitals and nursing note reviewed.  Constitutional:      Appearance: Normal appearance.  HENT:     Head: Atraumatic.  Eyes:     Extraocular Movements: Extraocular movements intact.     Conjunctiva/sclera: Conjunctivae normal.  Cardiovascular:      Rate and Rhythm: Normal rate and regular rhythm.  Pulmonary:     Effort: Pulmonary effort is normal.     Breath sounds: Normal breath sounds.  Musculoskeletal:        General: Normal range of motion.     Cervical back: Normal range of motion and neck supple.     Comments: Laxity of right 5th toe. No swelling, deformity, discoloration  Skin:    General: Skin is warm and dry.  Neurological:     General: No focal deficit present.     Mental Status: He is oriented to person, place, and time.     Cranial Nerves: No cranial nerve deficit.  Psychiatric:        Mood and Affect: Mood normal.        Thought Content: Thought content normal.        Judgment: Judgment normal.     Results for orders placed or performed in visit on 07/25/19  Lipid Panel w/o Chol/HDL Ratio  Result Value Ref Range   Cholesterol, Total 111 100 - 199 mg/dL   Triglycerides 96 0 - 149 mg/dL   HDL 38 (L) >39 mg/dL   VLDL Cholesterol Cal 19 5 - 40 mg/dL  LDL Calculated 54 0 - 99 mg/dL  Comprehensive metabolic panel  Result Value Ref Range   Glucose 183 (H) 65 - 99 mg/dL   BUN 16 8 - 27 mg/dL   Creatinine, Ser 1.09 0.76 - 1.27 mg/dL   GFR calc non Af Amer 68 >59 mL/min/1.73   GFR calc Af Amer 79 >59 mL/min/1.73   BUN/Creatinine Ratio 15 10 - 24   Sodium 138 134 - 144 mmol/L   Potassium 4.6 3.5 - 5.2 mmol/L   Chloride 99 96 - 106 mmol/L   CO2 23 20 - 29 mmol/L   Calcium 9.6 8.6 - 10.2 mg/dL   Total Protein 7.1 6.0 - 8.5 g/dL   Albumin 4.7 3.8 - 4.8 g/dL   Globulin, Total 2.4 1.5 - 4.5 g/dL   Albumin/Globulin Ratio 2.0 1.2 - 2.2   Bilirubin Total 0.5 0.0 - 1.2 mg/dL   Alkaline Phosphatase 76 39 - 117 IU/L   AST 19 0 - 40 IU/L   ALT 24 0 - 44 IU/L  HgB A1c  Result Value Ref Range   Hgb A1c MFr Bld 8.8 (H) 4.8 - 5.6 %   Est. average glucose Bld gHb Est-mCnc 206 mg/dL      Assessment & Plan:   Problem List Items Addressed This Visit      Cardiovascular and Mediastinum   Hypertension associated  with diabetes ([facility])    BPs high normal or just above normal. Continue to monitor closely, work on diet and exercise and call with persistent abnormal home readings.       Relevant Orders   Comprehensive metabolic panel     Endocrine   Type 2 diabetes mellitus with neurological complications ([facility]) - Primary    Recheck A1C, adjust as needed. Continue current regimen. Work on diet and exercise changes      Relevant Orders   HgB A1c     Other   Hyperlipidemia    Recheck lipids, adjust as needed      Relevant Orders   Lipid Panel w/o Chol/HDL Ratio   History of CVA (cerebrovascular accident)    Stable, under good control without new sxs. Continue current regimen      Relevant Orders   Comprehensive metabolic panel   Anxiety    Requesting a new script for klonopin. Takes no more than 2 per month, usually less but does have occasional severe panic episodes. Will refill and monitor closely. 1 script should last a year or more       Other Visit Diagnoses    Pain in toe of right foot       Suspect soft tissue strain, will try diclofenac gel, epsom salt soaks. f/u if not resolving   Nausea       and off balance sensation. suspect some variant of vertigo. Tx with meclizine, epley maneuvers.    Need for hepatitis C screening test       Relevant Orders   Hepatitis C antibody       Follow up plan: Return in about 3 months (around 06/16/2020) for DM.

## 2020-03-17 NOTE — Assessment & Plan Note (Signed)
Stable, under good control without new sxs. Continue current regimen

## 2020-03-17 NOTE — Assessment & Plan Note (Signed)
Recheck A1C, adjust as needed. Continue current regimen. Work on diet and exercise changes

## 2020-03-17 NOTE — Telephone Encounter (Signed)
Pt presented in office today for appt, at check out he stated that he has not been taking his amlodipine saying that his bp has been doing ok. Spoke with Richard Thornton and told her what pt stated. She says that she would like him to at least take half and they can discuss it at the next visit. Informed pt of providers advise, pt verbalized understanding.

## 2020-03-17 NOTE — Assessment & Plan Note (Signed)
Requesting a new script for klonopin. Takes no more than 2 per month, usually less but does have occasional severe panic episodes. Will refill and monitor closely. 1 script should last a year or more

## 2020-03-17 NOTE — Patient Instructions (Addendum)
Voltaren Gel topically to toe pain    How to Perform the Epley Maneuver The Epley maneuver is an exercise that relieves symptoms of vertigo. Vertigo is the feeling that you or your surroundings are moving when they are not. When you feel vertigo, you may feel like the room is spinning and have trouble walking. Dizziness is a little different than vertigo. When you are dizzy, you may feel unsteady or light-headed. You can do this maneuver at home whenever you have symptoms of vertigo. You can do it up to 3 times a day until your symptoms go away. Even though the Epley maneuver may relieve your vertigo for a few weeks, it is possible that your symptoms will return. This maneuver relieves vertigo, but it does not relieve dizziness. What are the risks? If it is done correctly, the Epley maneuver is considered safe. Sometimes it can lead to dizziness or nausea that goes away after a short time. If you develop other symptoms, such as changes in vision, weakness, or numbness, stop doing the maneuver and call your health care provider. How to perform the Epley maneuver 1. Sit on the edge of a bed or table with your back straight and your legs extended or hanging over the edge of the bed or table. 2. Turn your head halfway toward the affected ear or side. 3. Lie backward quickly with your head turned until you are lying flat on your back. You may want to position a pillow under your shoulders. 4. Hold this position for 30 seconds. You may experience an attack of vertigo. This is normal. 5. Turn your head to the opposite direction until your unaffected ear is facing the floor. 6. Hold this position for 30 seconds. You may experience an attack of vertigo. This is normal. Hold this position until the vertigo stops. 7. Turn your whole body to the same side as your head. Hold for another 30 seconds. 8. Sit back up. You can repeat this exercise up to 3 times a day. Follow these instructions at home:  After  doing the Epley maneuver, you can return to your normal activities.  Ask your health care provider if there is anything you should do at home to prevent vertigo. He or she may recommend that you: ? Keep your head raised (elevated) with two or more pillows while you sleep. ? Do not sleep on the side of your affected ear. ? Get up slowly from bed. ? Avoid sudden movements during the day. ? Avoid extreme head movement, like looking up or bending over. Contact a health care provider if:  Your vertigo gets worse.  You have other symptoms, including: ? Nausea. ? Vomiting. ? Headache. Get help right away if:  You have vision changes.  You have a severe or worsening headache or neck pain.  You cannot stop vomiting.  You have new numbness or weakness in any part of your body. Summary  Vertigo is the feeling that you or your surroundings are moving when they are not.  The Epley maneuver is an exercise that relieves symptoms of vertigo.  If the Epley maneuver is done correctly, it is considered safe. You can do it up to 3 times a day. This information is not intended to replace advice given to you by your health care provider. Make sure you discuss any questions you have with your health care provider. Document Revised: 11/16/2017 Document Reviewed: 10/24/2016 Elsevier Patient Education  2020 [employer].

## 2020-03-17 NOTE — Progress Notes (Signed)
Subjective:   Richard SAPIO Sr. is a 69 y.o. male who presents for Medicare Annual/Subsequent preventive examination.    Review of Systems:  Cardiac Risk Factors include: advanced age (>66men, >34 women);dyslipidemia;hypertension;male gender     Objective:    Vitals: BP (!) 158/93   Pulse (!) 59   Temp 97.9 F (36.6 C)   Ht 5\' 10"  (1.778 m)   Wt 208 lb (94.3 kg)   BMI 29.84 kg/m   Body mass index is 29.84 kg/m.  Advanced Directives 03/17/2020 08/30/2017 08/30/2017 04/30/2015 04/22/2015  Does Patient Have a Medical Advance Directive? No No No No No  Would patient like information on creating a medical advance directive? - No - Patient declined No - Patient declined - No - patient declined information    Tobacco Social History   Tobacco Use  Smoking Status Former Smoker  . Quit date: 12/18/2005  . Years since quitting: 14.2  Smokeless Tobacco Never Used     Counseling given: Not Answered   Clinical Intake:  Pre-visit preparation completed: Yes  Pain : 0-10 Pain Score: 3  Pain Type: Acute pain Pain Location: Hip Pain Orientation: Right Pain Descriptors / Indicators: Aching Pain Onset: More than a month ago     Nutritional Risks: None Diabetes: No  How often do you need to have someone help you when you read instructions, pamphlets, or other written materials from your doctor or pharmacy?: 1 - Never  Interpreter Needed?: No  Information entered by :: Sudie Bandel,LPN  Past Medical History:  Diagnosis Date  . Arthritis   . Diabetes mellitus without complication ([place])   . GERD (gastroesophageal reflux disease)   . Hemorrhoid   . Hyperlipidemia   . Hypertension   . Stroke [facility])    Past Surgical History:  Procedure Laterality Date  . ANAL FISTULOTOMY N/A 04/30/2015   Procedure: ANAL FISTULOTOMY;  Surgeon: Christene Lye, MD;  Location: [facility] ORS;  Service: General;  Laterality: N/A;  . COLONOSCOPY N/A 04/30/2015   Procedure: COLONOSCOPY;  Surgeon:  Christene Lye, MD;  Location: [facility] ORS;  Service: General;  Laterality: N/A;  . TONSILLECTOMY     Family History  Problem Relation Age of Onset  . Lung cancer Father   . Colon polyps Father   . Ovarian cancer Mother   . Heart disease Maternal Grandmother   . Heart disease Paternal Grandmother    Social History   Socioeconomic History  . Marital status: Single    Spouse name: Not on file  . Number of children: Not on file  . Years of education: Not on file  . Highest education level: Not on file  Occupational History  . Not on file  Tobacco Use  . Smoking status: Former Smoker    Quit date: 12/18/2005    Years since quitting: 14.2  . Smokeless tobacco: Never Used  Substance and Sexual Activity  . Alcohol use: Yes    Alcohol/week: 8.0 standard drinks    Types: 6 Cans of beer, 2 Standard drinks or equivalent per week  . Drug use: No  . Sexual activity: Not Currently  Other Topics Concern  . Not on file  Social History Narrative  . Not on file   Social Determinants of Health   Financial Resource Strain:   . Difficulty of Paying Living Expenses:   Food Insecurity:   . Worried About Charity fundraiser in the Last Year:   . [place] in the  Last Year:   Transportation Needs:   . Film/video editor (Medical):   Marland Kitchen Lack of Transportation (Non-Medical):   Physical Activity:   . Days of Exercise per Week:   . Minutes of Exercise per Session:   Stress:   . Feeling of Stress :   Social Connections:   . Frequency of Communication with Friends and Family:   . Frequency of Social Gatherings with Friends and Family:   . Attends Religious Services:   . Active Member of Clubs or Organizations:   . Attends Archivist Meetings:   Marland Kitchen Marital Status:     Outpatient Encounter Medications as of 03/17/2020  Medication Sig  . amLODipine (NORVASC) 10 MG tablet Take 1 tablet (10 mg total) by mouth daily.  Marland Kitchen aspirin EC 81 MG tablet Take 81 mg by mouth daily.    . clonazePAM (KLONOPIN) 0.5 MG tablet Take 0.5 mg by mouth 2 (two) times daily as needed for anxiety.  . gabapentin (NEURONTIN) 300 MG capsule Take 1 capsule (300 mg total) by mouth 3 (three) times daily as needed.  Marland Kitchen glipiZIDE (GLUCOTROL XL) 10 MG 24 hr tablet Take 1 tablet (10 mg total) by mouth daily with breakfast.  . hydrochlorothiazide (HYDRODIURIL) 25 MG tablet Take 1 tablet (25 mg total) by mouth daily.  Marland Kitchen losartan (COZAAR) 100 MG tablet Take 1 tablet (100 mg total) by mouth daily.  . metFORMIN (GLUCOPHAGE) 1000 MG tablet Take 1 tablet (1,000 mg total) by mouth 2 (two) times daily with a meal.  . metoprolol succinate (TOPROL-XL) 25 MG 24 hr tablet Take 1 tablet (25 mg total) by mouth daily.  Marland Kitchen omeprazole (PRILOSEC) 20 MG capsule Take 1 capsule (20 mg total) by mouth daily.  . rosuvastatin (CRESTOR) 20 MG tablet Take 1 tablet (20 mg total) by mouth daily.   No facility-administered encounter medications on file as of 03/17/2020.    Activities of Daily Living In your present state of health, do you have any difficulty performing the following activities: 03/17/2020  Hearing? N  Comment no hearing aids  Vision? N  Comment glasses, dr.woodard  Difficulty concentrating or making decisions? N  Walking or climbing stairs? N  Dressing or bathing? N  Doing errands, shopping? N  Preparing Food and eating ? N  Using the Toilet? N  In the past six months, have you accidently leaked urine? N  Do you have problems with loss of bowel control? N  Managing your Medications? N  Managing your Finances? N  Housekeeping or managing your Housekeeping? N  Some recent data might be hidden    Patient Care Team: Volney American, PA-C as PCP - General (Family Medicine) Christene Lye, MD (General Surgery) Gunnar Bulla (Physician Assistant)   Assessment:   This is a routine wellness examination for Richard Thornton.  Exercise Activities and Dietary recommendations Current  Exercise Habits: The patient has a physically strenuous job, but has no regular exercise apart from work., Exercise limited by: None identified  Goals Addressed   None     Fall Risk: Fall Risk  03/17/2020 03/13/2019 12/06/2018  Falls in the past year? 1 0 0  Number falls in past yr: 0 0 0  Comment fell on limb - -  Injury with Fall? 0 0 0  Comment aching - -    FALL RISK PREVENTION PERTAINING TO THE HOME:  Any stairs in or around the home? No  If so, are there any without handrails? No  Home free of loose throw rugs in walkways, pet beds, electrical cords, etc? Yes  Adequate lighting in your home to reduce risk of falls? Yes   ASSISTIVE DEVICES UTILIZED TO PREVENT FALLS:  Life alert? No  Use of a cane, walker or w/c? No  Grab bars in the bathroom? No  Shower chair or bench in shower? No  Elevated toilet seat or a handicapped toilet? No   TIMED UP AND GO:  Was the test performed? Yes .  Length of time to ambulate 10 feet: 8 sec.   GAIT:  Appearance of gait: Gait steady and fast  with/without the use of an assistive device.  Education: Fall risk prevention has been discussed.  Intervention(s) required? No  DME/home health order needed?  No   Depression Screen PHQ 2/9 Scores 03/17/2020 03/13/2019 12/06/2018  PHQ - 2 Score 0 0 0  PHQ- 9 Score - 0 1    Cognitive Function     6CIT Screen 03/17/2020  What Year? 0 points  What month? 0 points  What time? 0 points  Count back from 20 0 points  Months in reverse 0 points  Repeat phrase 0 points  Total Score 0     There is no immunization history on file for this patient.  Qualifies for Shingles Vaccine? Yes  Zostavax completed n/a. Due for Shingrix. Education has been provided regarding the importance of this vaccine. Pt has been advised to call insurance company to determine out of pocket expense. Advised may also receive vaccine at local pharmacy or Health Dept. Verbalized acceptance and understanding.  Tdap:  declined   Flu Vaccine: declined   Pneumococcal Vaccine: declined   Covid-19 Vaccine: Information provided  Screening Tests Health Maintenance  Topic Date Due  . Hepatitis C Screening  Never done  . FOOT EXAM  Never done  . OPHTHALMOLOGY EXAM  Never done  . TETANUS/TDAP  Never done  . HEMOGLOBIN A1C  01/25/2020  . INFLUENZA VACCINE  03/17/2020 (Originally 07/19/2019)  . PNA vac Low Risk Adult (1 of 2 - PCV13) 03/17/2021 (Originally 06/28/2014)  . COLONOSCOPY  04/29/2020   Cancer Screenings:  Colorectal Screening: Completed 04/30/2015. Repeat every 10 years  Lung Cancer Screening: (Low Dose CT Chest recommended if Age 62-80 years, 30 pack-year currently smoking OR have quit w/in 15years.) does not qualify.     Additional Screening:  Hepatitis C Screening: does qualify; ordered  Vision Screening: Recommended annual ophthalmology exams for early detection of glaucoma and other disorders of the eye. Is the patient up to date with their annual eye exam?  Yes  Who is the provider or what is the name of the office in which the pt attends annual eye exams? Dr.Woodard   Dental Screening: Recommended annual dental exams for proper oral hygiene  Community Resource Referral:  CRR required this visit?  No        Plan:  I have personally reviewed and addressed the Medicare Annual Wellness questionnaire and have noted the following in the patient's chart:  A. Medical and social history B. Use of alcohol, tobacco or illicit drugs  C. Current medications and supplements D. Functional ability and status E.  Nutritional status F.  Physical activity G. Advance directives H. List of other physicians I.  Hospitalizations, surgeries, and ER visits in previous 12 months J.  [place] such as hearing and vision if needed, cognitive and depression L. Referrals and appointments   In addition, I have reviewed and discussed with  patient certain preventive protocols, quality  metrics, and best practice recommendations. A written personalized care plan for preventive services as well as general preventive health recommendations were provided to patient.   Signed,   Bevelyn Ngo, LPN  [facility] Nurse Health Advisor   Nurse Notes: none

## 2020-03-17 NOTE — Patient Instructions (Addendum)
Richard Thornton , Thank you for taking time to come for your Medicare Wellness Visit. I appreciate your ongoing commitment to your health goals. Please review the following plan we discussed and let me know if I can assist you in the future.   Screening recommendations/referrals: Colonoscopy: completed 04/30/2015 Recommended yearly ophthalmology/optometry visit for glaucoma screening and checkup Recommended yearly dental visit for hygiene and checkup  Vaccinations: Influenza vaccine: declined  Pneumococcal vaccine: declined  Tdap vaccine: declined  Shingles vaccine: shingrix eligible    Covid-19: We are recommending the vaccine to everyone who has not had an allergic reaction to any of the components of the vaccine. If you have specific questions about the vaccine, please bring them up with your health care provider to discuss them.   We will likely not be getting the vaccine in the office for the first rounds of vaccinations. The way they are releasing the vaccines is going to be through the health systems (like [place], [facility], [facility], [facility]), through your county health department, or through the pharmacies.   The [facility] Department is giving vaccines to those 65+ and Health Care Workers Teachers and [place] providers start 02/11/20, Essential workers start 3/10 and those with co-morbidities start 03/10/20 Call [phone] to schedule  If you are 65+ you can get a vaccine through [facility] by signing up for an appointment.  You can sign up by going to: [email or web address].  You can get more information by going to: [email or web address]  [employer] next door is giving the [employer]- you can call [phone] or stop by there to schedule.  Advanced directives: Advance directive discussed with you today. I have provided a copy for you to complete at home and have notarized. Once this is complete please bring a copy in to our office so we can scan it  into your chart.  Conditions/risks identified: diabetic, please obtain diabetic eye exam.   Next appointment: Follow up in one year for your annual wellness visit.   Preventive Care 9 Years and Older, Male Preventive care refers to lifestyle choices and visits with your health care provider that can promote health and wellness. What does preventive care include?  A yearly physical exam. This is also called an annual well check.  Dental exams once or twice a year.  Routine eye exams. Ask your health care provider how often you should have your eyes checked.  Personal lifestyle choices, including:  Daily care of your teeth and gums.  Regular physical activity.  Eating a healthy diet.  Avoiding tobacco and drug use.  Limiting alcohol use.  Practicing safe sex.  Taking low doses of aspirin every day.  Taking vitamin and mineral supplements as recommended by your health care provider. What happens during an annual well check? The services and screenings done by your health care provider during your annual well check will depend on your age, overall health, lifestyle risk factors, and family history of disease. Counseling  Your health care provider may ask you questions about your:  Alcohol use.  Tobacco use.  Drug use.  Emotional well-being.  Home and relationship well-being.  Sexual activity.  Eating habits.  History of falls.  Memory and ability to understand (cognition).  Work and work Statistician. Screening  You may have the following tests or measurements:  Height, weight, and BMI.  Blood pressure.  Lipid and cholesterol levels. These may be checked every 5 years, or more frequently if you are over 24 years old.  Skin check.  Lung cancer screening. You may have this screening every year starting at age 34 if you have a 30-pack-year history of smoking and currently smoke or have quit within the past 15 years.  Fecal occult blood test (FOBT) of the  stool. You may have this test every year starting at age 53.  Flexible sigmoidoscopy or colonoscopy. You may have a sigmoidoscopy every 5 years or a colonoscopy every 10 years starting at age 30.  Prostate cancer screening. Recommendations will vary depending on your family history and other risks.  Hepatitis C blood test.  Hepatitis B blood test.  Sexually transmitted disease (STD) testing.  Diabetes screening. This is done by checking your blood sugar (glucose) after you have not eaten for a while (fasting). You may have this done every 1-3 years.  Abdominal aortic aneurysm (AAA) screening. You may need this if you are a current or former smoker.  Osteoporosis. You may be screened starting at age 52 if you are at high risk. Talk with your health care provider about your test results, treatment options, and if necessary, the need for more tests. Vaccines  Your health care provider may recommend certain vaccines, such as:  Influenza vaccine. This is recommended every year.  Tetanus, diphtheria, and acellular pertussis (Tdap, Td) vaccine. You may need a Td booster every 10 years.  Zoster vaccine. You may need this after age 14.  Pneumococcal 13-valent conjugate (PCV13) vaccine. One dose is recommended after age 29.  Pneumococcal polysaccharide (PPSV23) vaccine. One dose is recommended after age 22. Talk to your health care provider about which screenings and vaccines you need and how often you need them. This information is not intended to replace advice given to you by your health care provider. Make sure you discuss any questions you have with your health care provider. Document Released: 12/31/2015 Document Revised: 08/23/2016 Document Reviewed: 10/05/2015 Elsevier Interactive Patient Education  2017 [place] Prevention in the Home Falls can cause injuries. They can happen to people of all ages. There are many things you can do to make your home safe and to help  prevent falls. What can I do on the outside of my home?  Regularly fix the edges of walkways and driveways and fix any cracks.  Remove anything that might make you trip as you walk through a door, such as a raised step or threshold.  Trim any bushes or trees on the path to your home.  Use bright outdoor lighting.  Clear any walking paths of anything that might make someone trip, such as rocks or tools.  Regularly check to see if handrails are loose or broken. Make sure that both sides of any steps have handrails.  Any raised decks and porches should have guardrails on the edges.  Have any leaves, snow, or ice cleared regularly.  Use sand or salt on walking paths during winter.  Clean up any spills in your garage right away. This includes oil or grease spills. What can I do in the bathroom?  Use night lights.  Install grab bars by the toilet and in the tub and shower. Do not use towel bars as grab bars.  Use non-skid mats or decals in the tub or shower.  If you need to sit down in the shower, use a plastic, non-slip stool.  Keep the floor dry. Clean up any water that spills on the floor as soon as it happens.  Remove soap buildup in the tub or shower  regularly.  Attach bath mats securely with double-sided non-slip rug tape.  Do not have throw rugs and other things on the floor that can make you trip. What can I do in the bedroom?  Use night lights.  Make sure that you have a light by your bed that is easy to reach.  Do not use any sheets or blankets that are too big for your bed. They should not hang down onto the floor.  Have a firm chair that has side arms. You can use this for support while you get dressed.  Do not have throw rugs and other things on the floor that can make you trip. What can I do in the kitchen?  Clean up any spills right away.  Avoid walking on wet floors.  Keep items that you use a lot in easy-to-reach places.  If you need to reach  something above you, use a strong step stool that has a grab bar.  Keep electrical cords out of the way.  Do not use floor polish or wax that makes floors slippery. If you must use wax, use non-skid floor wax.  Do not have throw rugs and other things on the floor that can make you trip. What can I do with my stairs?  Do not leave any items on the stairs.  Make sure that there are handrails on both sides of the stairs and use them. Fix handrails that are broken or loose. Make sure that handrails are as long as the stairways.  Check any carpeting to make sure that it is firmly attached to the stairs. Fix any carpet that is loose or worn.  Avoid having throw rugs at the top or bottom of the stairs. If you do have throw rugs, attach them to the floor with carpet tape.  Make sure that you have a light switch at the top of the stairs and the bottom of the stairs. If you do not have them, ask someone to add them for you. What else can I do to help prevent falls?  Wear shoes that:  Do not have high heels.  Have rubber bottoms.  Are comfortable and fit you well.  Are closed at the toe. Do not wear sandals.  If you use a stepladder:  Make sure that it is fully opened. Do not climb a closed stepladder.  Make sure that both sides of the stepladder are locked into place.  Ask someone to hold it for you, if possible.  Clearly mark and make sure that you can see:  Any grab bars or handrails.  First and last steps.  Where the edge of each step is.  Use tools that help you move around (mobility aids) if they are needed. These include:  Canes.  Walkers.  Scooters.  Crutches.  Turn on the lights when you go into a dark area. Replace any light bulbs as soon as they burn out.  Set up your furniture so you have a clear path. Avoid moving your furniture around.  If any of your floors are uneven, fix them.  If there are any pets around you, be aware of where they are.  Review  your medicines with your doctor. Some medicines can make you feel dizzy. This can increase your chance of falling. Ask your doctor what other things that you can do to help prevent falls. This information is not intended to replace advice given to you by your health care provider. Make sure you discuss any questions  you have with your health care provider. Document Released: 09/30/2009 Document Revised: 05/11/2016 Document Reviewed: 01/08/2015 Elsevier Interactive Patient Education  2017 [employer].

## 2020-03-18 LAB — LIPID PANEL W/O CHOL/HDL RATIO
Cholesterol, Total: 112 mg/dL (ref 100–199)
HDL: 31 mg/dL — ABNORMAL LOW (ref >39)
LDL Chol Calc (NIH): 59 mg/dL (ref 0–99)
Triglycerides: 119 mg/dL (ref 0–149)
VLDL Cholesterol Cal: 22 mg/dL (ref 5–40)

## 2020-03-18 LAB — COMPREHENSIVE METABOLIC PANEL
ALT: 23 IU/L (ref 0–44)
AST: 19 IU/L (ref 0–40)
Albumin/Globulin Ratio: 2.1 (ref 1.2–2.2)
Albumin: 4.8 g/dL (ref 3.8–4.8)
Alkaline Phosphatase: 67 IU/L (ref 39–117)
BUN/Creatinine Ratio: 17 (ref 10–24)
BUN: 19 mg/dL (ref 8–27)
Bilirubin Total: 0.6 mg/dL (ref 0.0–1.2)
CO2: 22 mmol/L (ref 20–29)
Calcium: 9.6 mg/dL (ref 8.6–10.2)
Chloride: 97 mmol/L (ref 96–106)
Creatinine, Ser: 1.12 mg/dL (ref 0.76–1.27)
GFR calc Af Amer: 77 mL/min/{1.73_m2} (ref >59)
GFR calc non Af Amer: 66 mL/min/{1.73_m2} (ref >59)
Globulin, Total: 2.3 g/dL (ref 1.5–4.5)
Glucose: 205 mg/dL — ABNORMAL HIGH (ref 65–99)
Potassium: 4.6 mmol/L (ref 3.5–5.2)
Sodium: 139 mmol/L (ref 134–144)
Total Protein: 7.1 g/dL (ref 6.0–8.5)

## 2020-03-18 LAB — HEMOGLOBIN A1C
Est. average glucose Bld gHb Est-mCnc: 249 mg/dL
Hgb A1c MFr Bld: 10.3 % — ABNORMAL HIGH (ref 4.8–5.6)

## 2020-03-18 LAB — HEPATITIS C ANTIBODY: Hep C Virus Ab: <0.1 s/co ratio (ref 0.0–0.9)

## 2020-04-02 ENCOUNTER — Other Ambulatory Visit: Payer: Self-pay | Admitting: Family Medicine

## 2020-04-02 NOTE — Telephone Encounter (Signed)
Routing to provider  

## 2020-04-02 NOTE — Telephone Encounter (Signed)
Requested medication (s) are due for refill today: yes  Requested medication (s) are on the active medication list: yes  Last refill:  03/17/20  Future visit scheduled: yes  Notes to clinic:  not delegated    Requested Prescriptions  Pending Prescriptions Disp Refills   clonazePAM (KLONOPIN) 0.5 MG tablet [Pharmacy Med Name: CLONAZEPAM 0.5 MG TABLET] 20 tablet 0    Sig: Take 1 tablet (0.5 mg total) by mouth daily as needed for anxiety.      Not Delegated - Psychiatry:  Anxiolytics/Hypnotics Failed - 04/02/2020 11:51 AM      Failed - This refill cannot be delegated      Failed - Urine Drug Screen completed in last 360 days.      Passed - Valid encounter within last 6 months    Recent Outpatient Visits           2 weeks ago Type 2 diabetes mellitus with neurological complications [facility])   [facility] Mer[place] Roof [place], [place]   4 months ago Type 2 diabetes mellitus with neurological complications [place])   [place], [place], [place]   6 months ago Hypertension associated with diabetes [facility])   [place], [place], [place]   7 months ago Hypertension associated with diabetes [facility])   [place], [place], [place]   8 months ago Type 2 diabetes mellitus with neurological complications [facility])   [facility] Volney American, [place]       Future Appointments             In 2 months Orene Desanctis, Lilia Argue, [place], [place]   In 60 months  [employer], [place]

## 2020-04-02 NOTE — Telephone Encounter (Signed)
Duplicate request

## 2020-04-02 NOTE — Telephone Encounter (Signed)
Requested medication (s) are due for refill today: yes  Requested medication (s) are on the active medication list: yes  Last refill: 03/17/20  Future visit scheduled:yes  Notes to clinic:  Not delegated

## 2020-04-13 ENCOUNTER — Encounter: Payer: Self-pay | Admitting: Family Medicine

## 2020-04-13 ENCOUNTER — Ambulatory Visit (INDEPENDENT_AMBULATORY_CARE_PROVIDER_SITE_OTHER): Payer: PPO | Admitting: Family Medicine

## 2020-04-13 VITALS — BP 138/80 | HR 61

## 2020-04-13 DIAGNOSIS — R42 Dizziness and giddiness: Secondary | ICD-10-CM

## 2020-04-13 DIAGNOSIS — I10 Essential (primary) hypertension: Secondary | ICD-10-CM

## 2020-04-13 DIAGNOSIS — I152 Hypertension secondary to endocrine disorders: Secondary | ICD-10-CM

## 2020-04-13 DIAGNOSIS — R110 Nausea: Secondary | ICD-10-CM

## 2020-04-13 DIAGNOSIS — E1159 Type 2 diabetes mellitus with other circulatory complications: Secondary | ICD-10-CM | POA: Diagnosis not present

## 2020-04-13 NOTE — Progress Notes (Signed)
BP 138/80   Pulse 61    Subjective:    Patient ID: Richard Russian Sr., male    DOB: [birth year], 69 y.o.   MRN: [id number]  HPI: Richard MCCULLY Sr. is a 69 y.o. male  Chief Complaint  Patient presents with  . Hypotension    pt states he was feeling bad yesterday and had a lower than normal bp reading, states dystolic was 57    . This visit was completed via telephone due to the restrictions of the COVID-19 pandemic. All issues as above were discussed and addressed. Physical exam was done as above through visual confirmation on telephone. If it was felt that the patient should be evaluated in the office, they were directed there. The patient verbally consented to this visit. . Location of the patient: work . Location of the provider: home . Those involved with this call:  . Provider: Merrie Roof, PA-C . CMA: Lesle Chris, [place] . Front Desk/Registration: Jill Side  . Time spent on call: 15 minutes on the phone discussing health concerns. 5 minutes total spent in review of patient's record and preparation of their chart. I verified patient identity using two factors (patient name and date of birth). Patient consents verbally to being seen via telemedicine visit today.   Had stomach upset, nausea, dizziness, headache, neck ache yesterday. BS was normal, but BP was the lowest it's ever been since he started checking over a year ago - 120/50s range yesterday. Had not eaten yesterday when this happened. Tried the meclizine which made him drowsy so he went home, took a nap and felt better afterward. Denies fever, chills, syncope, CP, SOB. Feeling back to usual state of health today. Trying to work on diet changes to improve his diabetes and notes he hasn't eaten sweets in 3 weeks.   Relevant past medical, surgical, family and social history reviewed and updated as indicated. Interim medical history since our last visit reviewed. Allergies and medications reviewed and updated.  Review of  Systems  Per HPI unless specifically indicated above     Objective:    BP 138/80   Pulse 61   Wt Readings from Last 3 Encounters:  03/17/20 208 lb (94.3 kg)  03/17/20 208 lb (94.3 kg)  08/27/19 200 lb (90.7 kg)    Physical Exam  Unable to perform PE due to patient lack of access to video technology for today's visit.   Results for orders placed or performed in visit on 03/17/20  Hepatitis C antibody  Result Value Ref Range   Hep C Virus Ab <0.1 0.0 - 0.9 s/co ratio  Comprehensive metabolic panel  Result Value Ref Range   Glucose 205 (H) 65 - 99 mg/dL   BUN 19 8 - 27 mg/dL   Creatinine, Ser 1.12 0.76 - 1.27 mg/dL   GFR calc non Af Amer 66 >59 mL/min/1.73   GFR calc Af Amer 77 >59 mL/min/1.73   BUN/Creatinine Ratio 17 10 - 24   Sodium 139 134 - 144 mmol/L   Potassium 4.6 3.5 - 5.2 mmol/L   Chloride 97 96 - 106 mmol/L   CO2 22 20 - 29 mmol/L   Calcium 9.6 8.6 - 10.2 mg/dL   Total Protein 7.1 6.0 - 8.5 g/dL   Albumin 4.8 3.8 - 4.8 g/dL   Globulin, Total 2.3 1.5 - 4.5 g/dL   Albumin/Globulin Ratio 2.1 1.2 - 2.2   Bilirubin Total 0.6 0.0 - 1.2 mg/dL   Alkaline Phosphatase 67 39 -  117 IU/L   AST 19 0 - 40 IU/L   ALT 23 0 - 44 IU/L  Lipid Panel w/o Chol/HDL Ratio  Result Value Ref Range   Cholesterol, Total 112 100 - 199 mg/dL   Triglycerides 119 0 - 149 mg/dL   HDL 31 (L) >39 mg/dL   VLDL Cholesterol Cal 22 5 - 40 mg/dL   LDL Chol Calc (NIH) 59 0 - 99 mg/dL  HgB A1c  Result Value Ref Range   Hgb A1c MFr Bld 10.3 (H) 4.8 - 5.6 %   Est. average glucose Bld gHb Est-mCnc 249 mg/dL      Assessment & Plan:   Problem List Items Addressed This Visit      Cardiovascular and Mediastinum   Hypertension associated with diabetes ([place])    BPs during episode still within normal range, and sxs dissipated. Will continue to monitor home BPs as we may at some point need to reduce medication as his diet improves. Continue current regimen       Other Visit Diagnoses    Nausea     -  Primary   Resolved today, unclear whether viral illness or vertigo episode. Push fluids, rest as needed. F/u if sxs return   Dizziness           Follow up plan: Return for as scheduled.

## 2020-04-13 NOTE — Assessment & Plan Note (Signed)
BPs during episode still within normal range, and sxs dissipated. Will continue to monitor home BPs as we may at some point need to reduce medication as his diet improves. Continue current regimen

## 2020-05-21 ENCOUNTER — Other Ambulatory Visit: Payer: Self-pay | Admitting: Family Medicine

## 2020-05-24 ENCOUNTER — Other Ambulatory Visit: Payer: Self-pay | Admitting: Family Medicine

## 2020-05-24 DIAGNOSIS — Z86018 Personal history of other benign neoplasm: Secondary | ICD-10-CM | POA: Diagnosis not present

## 2020-05-24 DIAGNOSIS — L918 Other hypertrophic disorders of the skin: Secondary | ICD-10-CM | POA: Diagnosis not present

## 2020-05-24 DIAGNOSIS — L578 Other skin changes due to chronic exposure to nonionizing radiation: Secondary | ICD-10-CM | POA: Diagnosis not present

## 2020-05-24 DIAGNOSIS — Z872 Personal history of diseases of the skin and subcutaneous tissue: Secondary | ICD-10-CM | POA: Diagnosis not present

## 2020-06-17 ENCOUNTER — Encounter: Payer: Self-pay | Admitting: Family Medicine

## 2020-06-17 ENCOUNTER — Ambulatory Visit (INDEPENDENT_AMBULATORY_CARE_PROVIDER_SITE_OTHER): Payer: PPO | Admitting: Family Medicine

## 2020-06-17 ENCOUNTER — Other Ambulatory Visit: Payer: Self-pay

## 2020-06-17 VITALS — BP 172/83 | HR 65 | Temp 98.3°F | Wt 206.0 lb

## 2020-06-17 DIAGNOSIS — E1149 Type 2 diabetes mellitus with other diabetic neurological complication: Secondary | ICD-10-CM

## 2020-06-17 NOTE — Assessment & Plan Note (Signed)
Recheck A1C, adjust as needed. Continue current regimen and lifestyle modifications. Pt is hoping to avoid adding medication if possible

## 2020-06-17 NOTE — Progress Notes (Signed)
BP (!) 172/83   Pulse 65   Temp 98.3 F (36.8 C) (Oral)   Wt 206 lb (93.4 kg)   SpO2 97%   BMI 29.56 kg/m    Subjective:    Patient ID: Richard Russian Sr., male    DOB: 10/09/1951, 69 y.o.   MRN: [id number]  HPI: Richard DEPAUL Sr. is a 69 y.o. male  Chief Complaint  Patient presents with  . Diabetes   Here today for DM f/u. Has not been checking home BSs regularly but 136 to 140 the time or two he checked. Trying to eat much better and stays active. Taking medication faithfully without side effects. Denies polyuria, polydipsia, polyphagia.   Relevant past medical, surgical, family and social history reviewed and updated as indicated. Interim medical history since our last visit reviewed. Allergies and medications reviewed and updated.  Review of Systems  Per HPI unless specifically indicated above     Objective:    BP (!) 172/83   Pulse 65   Temp 98.3 F (36.8 C) (Oral)   Wt 206 lb (93.4 kg)   SpO2 97%   BMI 29.56 kg/m   Wt Readings from Last 3 Encounters:  06/17/20 206 lb (93.4 kg)  03/17/20 208 lb (94.3 kg)  03/17/20 208 lb (94.3 kg)    Physical Exam Vitals and nursing note reviewed.  Constitutional:      Appearance: Normal appearance.  HENT:     Head: Atraumatic.  Eyes:     Extraocular Movements: Extraocular movements intact.     Conjunctiva/sclera: Conjunctivae normal.  Cardiovascular:     Rate and Rhythm: Normal rate and regular rhythm.  Pulmonary:     Effort: Pulmonary effort is normal.     Breath sounds: Normal breath sounds.  Musculoskeletal:        General: Normal range of motion.     Cervical back: Normal range of motion and neck supple.  Skin:    General: Skin is warm and dry.  Neurological:     General: No focal deficit present.     Mental Status: He is oriented to person, place, and time.  Psychiatric:        Mood and Affect: Mood normal.        Thought Content: Thought content normal.        Judgment: Judgment normal.      Results for orders placed or performed in visit on 03/17/20  Hepatitis C antibody  Result Value Ref Range   Hep C Virus Ab <0.1 0.0 - 0.9 s/co ratio  Comprehensive metabolic panel  Result Value Ref Range   Glucose 205 (H) 65 - 99 mg/dL   BUN 19 8 - 27 mg/dL   Creatinine, Ser 1.12 0.76 - 1.27 mg/dL   GFR calc non Af Amer 66 >59 mL/min/1.73   GFR calc Af Amer 77 >59 mL/min/1.73   BUN/Creatinine Ratio 17 10 - 24   Sodium 139 134 - 144 mmol/L   Potassium 4.6 3.5 - 5.2 mmol/L   Chloride 97 96 - 106 mmol/L   CO2 22 20 - 29 mmol/L   Calcium 9.6 8.6 - 10.2 mg/dL   Total Protein 7.1 6.0 - 8.5 g/dL   Albumin 4.8 3.8 - 4.8 g/dL   Globulin, Total 2.3 1.5 - 4.5 g/dL   Albumin/Globulin Ratio 2.1 1.2 - 2.2   Bilirubin Total 0.6 0.0 - 1.2 mg/dL   Alkaline Phosphatase 67 39 - 117 IU/L   AST 19 0 -  40 IU/L   ALT 23 0 - 44 IU/L  Lipid Panel w/o Chol/HDL Ratio  Result Value Ref Range   Cholesterol, Total 112 100 - 199 mg/dL   Triglycerides 119 0 - 149 mg/dL   HDL 31 (L) >39 mg/dL   VLDL Cholesterol Cal 22 5 - 40 mg/dL   LDL Chol Calc (NIH) 59 0 - 99 mg/dL  HgB A1c  Result Value Ref Range   Hgb A1c MFr Bld 10.3 (H) 4.8 - 5.6 %   Est. average glucose Bld gHb Est-mCnc 249 mg/dL      Assessment & Plan:   Problem List Items Addressed This Visit      Endocrine   Type 2 diabetes mellitus with neurological complications (HCC) - Primary    Recheck A1C, adjust as needed. Continue current regimen and lifestyle modifications. Pt is hoping to avoid adding medication if possible      Relevant Orders   HgB A1c       Follow up plan: Return in about 3 months (around 09/17/2020) for 6 month f/u.

## 2020-06-18 LAB — HEMOGLOBIN A1C
Est. average glucose Bld gHb Est-mCnc: 189 mg/dL
Hgb A1c MFr Bld: 8.2 % — ABNORMAL HIGH (ref 4.8–5.6)

## 2020-06-24 ENCOUNTER — Other Ambulatory Visit: Payer: Self-pay | Admitting: Family Medicine

## 2020-07-26 ENCOUNTER — Other Ambulatory Visit: Payer: Self-pay | Admitting: Family Medicine

## 2020-08-02 ENCOUNTER — Other Ambulatory Visit: Payer: Self-pay | Admitting: Family Medicine

## 2020-08-02 NOTE — Telephone Encounter (Signed)
Requested Prescriptions  Pending Prescriptions Disp Refills  . gabapentin (NEURONTIN) 300 MG capsule [Pharmacy Med Name: GABAPENTIN 300 MG CAPSULE] 270 capsule 1    Sig: Take 1 capsule (300 mg total) by mouth 3 (three) times daily as needed.     Neurology: Anticonvulsants - gabapentin Passed - 08/02/2020  4:42 PM      Passed - Valid encounter within last 12 months    Recent Outpatient Visits          1 month ago Type 2 diabetes mellitus with neurological complications [facility])   [facility] Volney American, [place]   3 months ago Nausea   [facility] Merrie Roof [place], [place]   4 months ago Type 2 diabetes mellitus with neurological complications [facility])   [place], [place], [place]   8 months ago Type 2 diabetes mellitus with neurological complications [facility])   [place], [place], [place]   10 months ago Hypertension associated with diabetes [facility])   [facility] Volney American, [place]      Future Appointments            In 1 month [place], Burnett Kanaris, NP [employer], [place]   In 7 months  [employer], [place]

## 2020-08-16 ENCOUNTER — Other Ambulatory Visit: Payer: Self-pay | Admitting: Family Medicine

## 2020-08-25 DIAGNOSIS — D485 Neoplasm of uncertain behavior of skin: Secondary | ICD-10-CM | POA: Diagnosis not present

## 2020-08-25 DIAGNOSIS — D1723 Benign lipomatous neoplasm of skin and subcutaneous tissue of right leg: Secondary | ICD-10-CM | POA: Diagnosis not present

## 2020-09-01 ENCOUNTER — Other Ambulatory Visit: Payer: Self-pay | Admitting: Family Medicine

## 2020-09-01 MED ORDER — HYDROCHLOROTHIAZIDE 25 MG PO TABS: 25.0000 mg | ORAL_TABLET | Freq: Every day | ORAL | 0 refills | Status: DC

## 2020-09-01 NOTE — Telephone Encounter (Signed)
hydrochlorothiazide (HYDRODIURIL) 25 MG tablet Medication Date: 12/01/2019 Department: [facility] Ordering/Authorizing: [place], PA-C  Order Providers  [place], [place] - [place] Phone:  [phone]  Fax:  [phone]

## 2020-09-03 ENCOUNTER — Other Ambulatory Visit: Payer: Self-pay | Admitting: Family Medicine

## 2020-09-06 ENCOUNTER — Other Ambulatory Visit: Payer: Self-pay | Admitting: Family Medicine

## 2020-09-17 ENCOUNTER — Ambulatory Visit: Payer: PPO | Admitting: Family Medicine

## 2020-09-17 ENCOUNTER — Ambulatory Visit: Payer: PPO | Admitting: Nurse Practitioner

## 2020-09-20 ENCOUNTER — Other Ambulatory Visit: Payer: Self-pay | Admitting: Family Medicine

## 2020-09-20 NOTE — Telephone Encounter (Signed)
Requested Prescriptions  Pending Prescriptions Disp Refills   losartan (COZAAR) 100 MG tablet [Pharmacy Med Name: LOSARTAN POTASSIUM 100 MG TAB] 90 tablet 0    Sig: Take 1 tablet (100 mg total) by mouth daily.     Cardiovascular:  Angiotensin Receptor Blockers Failed - 09/20/2020  2:36 PM      Failed - Cr in normal range and within 180 days    Creatinine  Date Value Ref Range Status  11/26/2014 0.98 0.60 - 1.30 mg/dL Final   Creatinine, Ser  Date Value Ref Range Status  03/17/2020 1.12 0.76 - 1.27 mg/dL Final         Failed - K in normal range and within 180 days    Potassium  Date Value Ref Range Status  03/17/2020 4.6 3.5 - 5.2 mmol/L Final  11/26/2014 4.4 3.5 - 5.1 mmol/L Final         Failed - Last BP in normal range    BP Readings from Last 1 Encounters:  06/17/20 (!) 172/83         Passed - Patient is not pregnant      Passed - Valid encounter within last 6 months    Recent Outpatient Visits          3 months ago Type 2 diabetes mellitus with neurological complications [facility])   [place], [place], PA-C   5 months ago Nausea   [facility] Volney American, [place]   6 months ago Type 2 diabetes mellitus with neurological complications [facility])   [place], [place], [place]   9 months ago Type 2 diabetes mellitus with neurological complications [facility])   [place], [place], [place]   11 months ago Hypertension associated with diabetes [facility])   [facility] Volney American, [place]      Future Appointments            In 1 week Eulogio Bear, NP [facility], [place]   In 6 months  [employer], [place]

## 2020-09-27 NOTE — Progress Notes (Signed)
BP (!) 167/78 (BP Location: Left Arm, Cuff Size: Normal)   Pulse 65   Temp 98.5 F (36.9 C) (Oral)   Wt 209 lb 3.2 oz (94.9 kg)   SpO2 98%   BMI 30.02 kg/m    Subjective:    Patient ID: Richard Russian Sr., male    DOB: [birth year], 69 y.o.   MRN: [id number]  HPI: Richard CHUI Sr. is a 69 y.o. male presenting for follow-up of diabetes, blood pressure, and cholesterol.  Chief Complaint  Patient presents with  . Anxiety  . Diabetes    no recent eyeexam per patient  . Hyperlipidemia    pt states he has been having leg weakness on rosuvastatin  . Hypertension    DIABETES Hypoglycemic episodes:no Polydipsia/polyuria: no Visual disturbance: no Chest pain: no Paresthesias: yes Glucose Monitoring: no Taking Insulin?: no Blood Pressure Monitoring: weekly BP at home: 140s/80s Retinal Examination: Not up to Date Foot Exam: done today Diabetic Education: Completed Pneumovax: Not up to Date Influenza: Not up to Date Aspirin: yes  HYPERTENSION / HYPERLIPIDEMIA Satisfied with current treatment? yes Duration of hypertension: chronic BP monitoring frequency: weekly BP range: 140s/80s BP medication side effects: no Past BP meds: hctz 25, losartan 100 mg, metoprolol 25 mg Duration of hyperlipidemia: chronic Cholesterol medication side effects: yes - leg weakness Cholesterol supplements: none Past cholesterol medications: rosuvastatin Medication compliance: excellent compliance Aspirin: yes Recent stressors: yes Recurrent headaches: no Visual changes: no Palpitations: no Dyspnea: no Chest pain: no Lower extremity edema: no Dizzy/lightheaded: yes 2-3x/week ; comes and goes - went fishing and got very sea sick which does not usually happen Leg weakness: yes in afternoon  ANXIETY/STRESS Duration: chronic Anxious mood: yes; very seldom has panic episodes which he uses as needed clonazepam for.   Excessive worrying: no Irritability: no  Sweating: no Nausea:  no Palpitations:no Hyperventilation: no Panic attacks: yes Agoraphobia: no  Obscessions/compulsions: no Depressed mood: no Depression screen [facility] 2/9 09/28/2020 03/17/2020 03/13/2019 12/06/2018  Decreased Interest 0 0 0 0  Down, Depressed, Hopeless 0 0 0 0  PHQ - 2 Score 0 0 0 0  Altered sleeping 0 - 0 0  Tired, decreased energy 1 - 0 1  Change in appetite 1 - 0 0  Feeling bad or failure about yourself  0 - 0 0  Trouble concentrating 0 - 0 0  Moving slowly or fidgety/restless 0 - 0 0  Suicidal thoughts 0 - 0 0  PHQ-9 Score 2 - 0 1  Difficult doing work/chores Not difficult at all - - Not difficult at all    GAD 7 : Generalized Anxiety Score 09/28/2020 03/13/2019 12/06/2018  Nervous, Anxious, on Edge _0 Control/stop worrying 3 0 3  Worry too much - different things 3 0 0  Trouble relaxing 0 0 0  Restless 0 0 0  Easily annoyed or irritable _1 Afraid - awful might happen 0 0 1  Total GAD 7 Score _2 Anxiety Difficulty Not difficult at all - Somewhat difficult   Anhedonia: no Weight changes: no Insomnia: no   Hypersomnia: no Fatigue/loss of energy: no Feelings of worthlessness: no Feelings of guilt: no Impaired concentration/indecisiveness: no Suicidal ideations: no  Crying spells: no Recent Stressors/Life Changes: no   Relationship problems: no   Family stress: no     Financial stress: no    Job stress: yes    Recent death/loss: no  Allergies  Allergen Reactions  .  Amlodipine Swelling   Outpatient Encounter Medications as of 09/28/2020  Medication Sig  . aspirin EC 81 MG tablet Take 81 mg by mouth daily.  . Blood Glucose Monitoring Suppl (ONE TOUCH ULTRA 2) w/Device KIT   . clonazePAM (KLONOPIN) 0.5 MG tablet Take 1 tablet (0.5 mg total) by mouth daily as needed for anxiety.  . gabapentin (NEURONTIN) 300 MG capsule Take 1 capsule (300 mg total) by mouth 3 (three) times daily as needed.  . glipiZIDE (GLUCOTROL XL) 10 MG 24 hr tablet Take 1 tablet (10 mg  total) by mouth daily with breakfast.  . hydrochlorothiazide (HYDRODIURIL) 25 MG tablet Take 1 tablet (25 mg total) by mouth daily.  . Lancets (ONETOUCH DELICA PLUS LANCET33G) MISC   . losartan (COZAAR) 100 MG tablet Take 1 tablet (100 mg total) by mouth daily.  . meclizine (ANTIVERT) 25 MG tablet Take 1 tablet (25 mg total) by mouth 3 (three) times daily as needed for dizziness.  . metFORMIN (GLUCOPHAGE) 1000 MG tablet Take 1 tablet (1,000 mg total) by mouth 2 (two) times daily with a meal.  . metoprolol succinate (TOPROL-XL) 25 MG 24 hr tablet Take 1 tablet (25 mg total) by mouth daily.  . omeprazole (PRILOSEC) 20 MG capsule Take 1 capsule (20 mg total) by mouth daily.  . ONETOUCH ULTRA test strip 1 each 2 (two) times daily.  . rosuvastatin (CRESTOR) 20 MG tablet Take 1 tablet (20 mg total) by mouth daily.  . [DISCONTINUED] clonazePAM (KLONOPIN) 0.5 MG tablet Take 1 tablet (0.5 mg total) by mouth daily as needed for anxiety.  . [DISCONTINUED] gabapentin (NEURONTIN) 300 MG capsule Take 1 capsule (300 mg total) by mouth 3 (three) times daily as needed.  . [DISCONTINUED] glipiZIDE (GLUCOTROL XL) 10 MG 24 hr tablet Take 1 tablet (10 mg total) by mouth daily with breakfast.  . [DISCONTINUED] hydrochlorothiazide (HYDRODIURIL) 25 MG tablet Take 1 tablet (25 mg total) by mouth daily.  . [DISCONTINUED] losartan (COZAAR) 100 MG tablet Take 1 tablet (100 mg total) by mouth daily.  . [DISCONTINUED] metFORMIN (GLUCOPHAGE) 1000 MG tablet Take 1 tablet (1,000 mg total) by mouth 2 (two) times daily with a meal.  . [DISCONTINUED] metoprolol succinate (TOPROL-XL) 25 MG 24 hr tablet Take 1 tablet (25 mg total) by mouth daily.  . [DISCONTINUED] rosuvastatin (CRESTOR) 20 MG tablet Take 1 tablet (20 mg total) by mouth daily.   No facility-administered encounter medications on file as of 09/28/2020.   Patient Active Problem List   Diagnosis Date Noted  . Murmur, cardiac 09/29/2020  . History of CVA  (cerebrovascular accident) 12/09/2018  . Anxiety 12/09/2018  . Type 2 diabetes mellitus with neurological complications (HCC) 12/06/2018  . Hyperlipidemia 12/06/2018  . Hypertension associated with diabetes (HCC) 12/06/2018  . Right thalamic infarction (HCC) 10/05/2017  . TIA (transient ischemic attack) 08/30/2017   Past Medical History:  Diagnosis Date  . Arthritis   . Diabetes mellitus without complication (HCC)   . GERD (gastroesophageal reflux disease)   . Hemorrhoid   . Hyperlipidemia   . Hypertension   . Stroke (HCC)    Relevant past medical, surgical, family and social history reviewed and updated as indicated. Interim medical history since our last visit reviewed.  Review of Systems  Constitutional: Negative.  Negative for activity change, appetite change, diaphoresis, fatigue and fever.  HENT: Negative.   Eyes: Negative.  Negative for visual disturbance.  Respiratory: Negative.  Negative for cough, shortness of breath and wheezing.   Cardiovascular: Negative.    Negative for chest pain, palpitations and leg swelling.  Gastrointestinal: Negative.  Negative for abdominal pain, blood in stool, constipation, diarrhea, nausea and vomiting.  Endocrine: Negative.   Musculoskeletal: Negative.   Skin: Negative.  Negative for rash.  Neurological: Positive for dizziness, weakness (legs in the afternoon) and numbness. Negative for light-headedness and headaches.  Psychiatric/Behavioral: Negative.  Negative for confusion, self-injury and sleep disturbance.    Per HPI unless specifically indicated above     Objective:    BP (!) 167/78 (BP Location: Left Arm, Cuff Size: Normal)   Pulse 65   Temp 98.5 F (36.9 C) (Oral)   Wt 209 lb 3.2 oz (94.9 kg)   SpO2 98%   BMI 30.02 kg/m   Wt Readings from Last 3 Encounters:  09/28/20 209 lb 3.2 oz (94.9 kg)  06/17/20 206 lb (93.4 kg)  03/17/20 208 lb (94.3 kg)    Physical Exam Vitals and nursing note reviewed.  Constitutional:       General: He is not in acute distress.    Appearance: Normal appearance. He is obese.  Eyes:     General: No scleral icterus.    Extraocular Movements: Extraocular movements intact.  Neck:     Vascular: No carotid bruit.  Cardiovascular:     Rate and Rhythm: Normal rate.     Heart sounds: Murmur heard.   Pulmonary:     Effort: Pulmonary effort is normal. No respiratory distress.     Breath sounds: Normal breath sounds. No wheezing or rhonchi.  Abdominal:     General: Abdomen is flat. Bowel sounds are normal. There is no distension.  Musculoskeletal:        General: Normal range of motion.     Cervical back: Normal range of motion.     Right lower leg: No edema.     Left lower leg: No edema.  Skin:    General: Skin is warm and dry.     Coloration: Skin is not jaundiced or pale.  Neurological:     General: No focal deficit present.     Mental Status: He is alert and oriented to person, place, and time.     Motor: No weakness.     Gait: Gait normal.  Psychiatric:        Mood and Affect: Mood normal.        Behavior: Behavior normal.        Thought Content: Thought content normal.        Judgment: Judgment normal.       Assessment & Plan:   Problem List Items Addressed This Visit      Cardiovascular and Mediastinum   Hypertension associated with diabetes ([place])    Chronic, ongoing.  Blood pressure remains elevated today in office on recheck as well as running above goal at home.  Really would like blood pressure to be less than 130/80 with history of stroke.  Will resume low-dose amlodipine-is listed as an allergy because of lower extremity swelling however is willing to try at a very low dose like 2.5 mg.  Patient has a supply of amlodipine 5 mg left at home and will cut these in half to make 2.5 mg.  Instructed to take 2.5 mg daily and will follow up in 4 weeks.  CMP checked today.      Relevant Medications   metFORMIN (GLUCOPHAGE) 1000 MG tablet   glipiZIDE (GLUCOTROL XL)  10 MG 24 hr tablet   losartan (COZAAR) 100 MG tablet  rosuvastatin (CRESTOR) 20 MG tablet   hydrochlorothiazide (HYDRODIURIL) 25 MG tablet   metoprolol succinate (TOPROL-XL) 25 MG 24 hr tablet   Other Relevant Orders   CBC with Differential/Platelet (Completed)     Endocrine   Type 2 diabetes mellitus with neurological complications ([place]) - Primary    Chronic, ongoing.  Patient does not check blood sugar routinely at home-educated on this.  Would like patient to check at least fasting glucoses daily.  We will continue current medications for now-Metformin 1000 mg twice daily and glipizide 10 mg daily.  Will check A1c today with CMP and CBC.  Plans to reach out to ophthalmologist for eye examination.  Foot exam done today.  Declines pneumonia and flu shots.  Follow-up 3 months.      Relevant Medications   metFORMIN (GLUCOPHAGE) 1000 MG tablet   glipiZIDE (GLUCOTROL XL) 10 MG 24 hr tablet   losartan (COZAAR) 100 MG tablet   rosuvastatin (CRESTOR) 20 MG tablet   Other Relevant Orders   HgB A1c (Completed)   Comprehensive metabolic panel (Completed)     Other   Hyperlipidemia    Chronic, ongoing.  LDL goal should be less than 70 given history of stroke.  Will check lipids today.  For now, will decrease rosuvastatin to 10 mg with patient complains of leg weakness.  May want to consider additional therapy like Zetia in future.      Relevant Medications   losartan (COZAAR) 100 MG tablet   rosuvastatin (CRESTOR) 20 MG tablet   hydrochlorothiazide (HYDRODIURIL) 25 MG tablet   metoprolol succinate (TOPROL-XL) 25 MG 24 hr tablet   Other Relevant Orders   Comprehensive metabolic panel (Completed)   Lipid Panel w/o Chol/HDL Ratio (Completed)   History of CVA (cerebrovascular accident)    No new symptoms today.  Continue with aggressive treatment of blood pressure, diabetes, and cholesterol.      Anxiety    Chronic, stable.  Does not take more than 1 pill/week.  Does have occasional panic  episodes especially when waking up at nighttime.  Not interested in a daily medication at this time.  PDMP reviewed and appropriate, will give refill for Klonopin.  Continue to monitor closely-if patient requesting refill earlier than 6 months, may need to make changes.      Relevant Orders   CBC with Differential/Platelet (Completed)   Murmur, cardiac    Incidentally noted on exam today.  Will obtain echocardiogram for further work-up especially given history of heart failure and stroke.  May need to refer to cardiology in future for complete work-up.      Relevant Orders   ECHOCARDIOGRAM COMPLETE       Follow up plan: Return in about 4 weeks (around 10/26/2020) for BP f/u.

## 2020-09-28 ENCOUNTER — Encounter: Payer: Self-pay | Admitting: Nurse Practitioner

## 2020-09-28 ENCOUNTER — Other Ambulatory Visit: Payer: Self-pay

## 2020-09-28 ENCOUNTER — Ambulatory Visit (INDEPENDENT_AMBULATORY_CARE_PROVIDER_SITE_OTHER): Payer: PPO | Admitting: Nurse Practitioner

## 2020-09-28 VITALS — BP 167/78 | HR 65 | Temp 98.5°F | Wt 209.2 lb

## 2020-09-28 DIAGNOSIS — I152 Hypertension secondary to endocrine disorders: Secondary | ICD-10-CM | POA: Diagnosis not present

## 2020-09-28 DIAGNOSIS — Z8673 Personal history of transient ischemic attack (TIA), and cerebral infarction without residual deficits: Secondary | ICD-10-CM

## 2020-09-28 DIAGNOSIS — E782 Mixed hyperlipidemia: Secondary | ICD-10-CM

## 2020-09-28 DIAGNOSIS — F419 Anxiety disorder, unspecified: Secondary | ICD-10-CM | POA: Diagnosis not present

## 2020-09-28 DIAGNOSIS — E1159 Type 2 diabetes mellitus with other circulatory complications: Secondary | ICD-10-CM | POA: Diagnosis not present

## 2020-09-28 DIAGNOSIS — R011 Cardiac murmur, unspecified: Secondary | ICD-10-CM | POA: Diagnosis not present

## 2020-09-28 DIAGNOSIS — E1149 Type 2 diabetes mellitus with other diabetic neurological complication: Secondary | ICD-10-CM

## 2020-09-28 NOTE — Patient Instructions (Addendum)
Richard Thornton seeing you today. We will message you tomorrow with your lab results.  For your medicine, be sure you are taking the HCTZ (hydrochlorathiazide) in the morning.   Restart the amlodipine for your blood pressure.  Take 1/2 tablet of the 5 mg tablets you have at home to make a 2.5mg  once daily in the morning.  Be sure to check your blood pressure a couple of times per week at home and we will follow up in 1 month.    Take glipizide and metformin with breakfast.  Take gabapentin the same way that you currently are.  Take losartan, rosuvastatin (can take 1/2 tablet of this to see if your leg weakness gets better), omeprazole, metformin and metoprolol at night.

## 2020-09-29 ENCOUNTER — Encounter: Payer: Self-pay | Admitting: Nurse Practitioner

## 2020-09-29 DIAGNOSIS — R011 Cardiac murmur, unspecified: Secondary | ICD-10-CM | POA: Insufficient documentation

## 2020-09-29 LAB — LIPID PANEL W/O CHOL/HDL RATIO
Cholesterol, Total: 114 mg/dL (ref 100–199)
HDL: 34 mg/dL — ABNORMAL LOW (ref >39)
LDL Chol Calc (NIH): 57 mg/dL (ref 0–99)
Triglycerides: 127 mg/dL (ref 0–149)
VLDL Cholesterol Cal: 23 mg/dL (ref 5–40)

## 2020-09-29 LAB — CBC WITH DIFFERENTIAL/PLATELET
Basophils Absolute: 0.1 10*3/uL (ref 0.0–0.2)
Basos: 1 %
EOS (ABSOLUTE): 0.1 10*3/uL (ref 0.0–0.4)
Eos: 2 %
Hematocrit: 44.6 % (ref 37.5–51.0)
Hemoglobin: 14.8 g/dL (ref 13.0–17.7)
Immature Grans (Abs): 0 10*3/uL (ref 0.0–0.1)
Immature Granulocytes: 0 %
Lymphocytes Absolute: 1.4 10*3/uL (ref 0.7–3.1)
Lymphs: 22 %
MCH: 29.7 pg (ref 26.6–33.0)
MCHC: 33.2 g/dL (ref 31.5–35.7)
MCV: 89 fL (ref 79–97)
Monocytes Absolute: 0.5 10*3/uL (ref 0.1–0.9)
Monocytes: 8 %
Neutrophils Absolute: 4.4 10*3/uL (ref 1.4–7.0)
Neutrophils: 67 %
Platelets: 165 10*3/uL (ref 150–450)
RBC: 4.99 x10E6/uL (ref 4.14–5.80)
RDW: 12.2 % (ref 11.6–15.4)
WBC: 6.4 10*3/uL (ref 3.4–10.8)

## 2020-09-29 LAB — COMPREHENSIVE METABOLIC PANEL
ALT: 24 IU/L (ref 0–44)
AST: 16 IU/L (ref 0–40)
Albumin/Globulin Ratio: 1.9 (ref 1.2–2.2)
Albumin: 4.8 g/dL — ABNORMAL HIGH (ref 3.7–4.7)
Alkaline Phosphatase: 63 IU/L (ref 44–121)
BUN/Creatinine Ratio: 14 (ref 10–24)
BUN: 17 mg/dL (ref 8–27)
Bilirubin Total: 0.5 mg/dL (ref 0.0–1.2)
CO2: 25 mmol/L (ref 20–29)
Calcium: 9.7 mg/dL (ref 8.6–10.2)
Chloride: 96 mmol/L (ref 96–106)
Creatinine, Ser: 1.23 mg/dL (ref 0.76–1.27)
GFR calc Af Amer: 68 mL/min/{1.73_m2} (ref >59)
GFR calc non Af Amer: 59 mL/min/{1.73_m2} — ABNORMAL LOW (ref >59)
Globulin, Total: 2.5 g/dL (ref 1.5–4.5)
Glucose: 222 mg/dL — ABNORMAL HIGH (ref 65–99)
Potassium: 4.4 mmol/L (ref 3.5–5.2)
Sodium: 136 mmol/L (ref 134–144)
Total Protein: 7.3 g/dL (ref 6.0–8.5)

## 2020-09-29 LAB — HEMOGLOBIN A1C
Est. average glucose Bld gHb Est-mCnc: 229 mg/dL
Hgb A1c MFr Bld: 9.6 % — ABNORMAL HIGH (ref 4.8–5.6)

## 2020-09-29 MED ORDER — METOPROLOL SUCCINATE ER 25 MG PO TB24: 25.0000 mg | ORAL_TABLET | Freq: Every day | ORAL | 1 refills | Status: AC

## 2020-09-29 MED ORDER — GABAPENTIN 300 MG PO CAPS: 300.0000 mg | ORAL_CAPSULE | Freq: Three times a day (TID) | ORAL | 1 refills | Status: AC | PRN

## 2020-09-29 MED ORDER — CLONAZEPAM 0.5 MG PO TABS: 0.5000 mg | ORAL_TABLET | Freq: Every day | ORAL | 0 refills | Status: AC | PRN

## 2020-09-29 MED ORDER — ROSUVASTATIN CALCIUM 20 MG PO TABS: 20.0000 mg | ORAL_TABLET | Freq: Every day | ORAL | 1 refills | Status: AC

## 2020-09-29 MED ORDER — METFORMIN HCL 1000 MG PO TABS: 1000.0000 mg | ORAL_TABLET | Freq: Two times a day (BID) | ORAL | 1 refills | Status: AC

## 2020-09-29 MED ORDER — LOSARTAN POTASSIUM 100 MG PO TABS: 100.0000 mg | ORAL_TABLET | Freq: Every day | ORAL | 1 refills | Status: DC

## 2020-09-29 MED ORDER — GLIPIZIDE ER 10 MG PO TB24: 10.0000 mg | ORAL_TABLET | Freq: Every day | ORAL | 1 refills | Status: DC

## 2020-09-29 MED ORDER — HYDROCHLOROTHIAZIDE 25 MG PO TABS: 25.0000 mg | ORAL_TABLET | Freq: Every day | ORAL | 1 refills | Status: DC

## 2020-09-29 NOTE — Assessment & Plan Note (Addendum)
Chronic, ongoing.  Blood pressure remains elevated today in office on recheck as well as running above goal at home.  Really would like blood pressure to be less than 130/80 with history of stroke.  Will resume low-dose amlodipine-is listed as an allergy because of lower extremity swelling however is willing to try at a very low dose like 2.5 mg.  Patient has a supply of amlodipine 5 mg left at home and will cut these in half to make 2.5 mg.  Instructed to take 2.5 mg daily and will follow up in 4 weeks.  CMP checked today.

## 2020-09-29 NOTE — Assessment & Plan Note (Signed)
Chronic, ongoing.  LDL goal should be less than 70 given history of stroke.  Will check lipids today.  For now, will decrease rosuvastatin to 10 mg with patient complains of leg weakness.  May want to consider additional therapy like Zetia in future.

## 2020-09-29 NOTE — Assessment & Plan Note (Signed)
Incidentally noted on exam today.  Will obtain echocardiogram for further work-up especially given history of heart failure and stroke.  May need to refer to cardiology in future for complete work-up.

## 2020-09-29 NOTE — Assessment & Plan Note (Signed)
Chronic, stable.  Does not take more than 1 pill/week.  Does have occasional panic episodes especially when waking up at nighttime.  Not interested in a daily medication at this time.  PDMP reviewed and appropriate, will give refill for Klonopin.  Continue to monitor closely-if patient requesting refill earlier than 6 months, may need to make changes.

## 2020-09-29 NOTE — Assessment & Plan Note (Signed)
Chronic, ongoing.  Patient does not check blood sugar routinely at home-educated on this.  Would like patient to check at least fasting glucoses daily.  We will continue current medications for now-Metformin 1000 mg twice daily and glipizide 10 mg daily.  Will check A1c today with CMP and CBC.  Plans to reach out to ophthalmologist for eye examination.  Foot exam done today.  Declines pneumonia and flu shots.  Follow-up 3 months.

## 2020-09-29 NOTE — Assessment & Plan Note (Signed)
No new symptoms today.  Continue with aggressive treatment of blood pressure, diabetes, and cholesterol.

## 2020-09-30 MED ORDER — GLIPIZIDE ER 10 MG PO TB24: 20.0000 mg | ORAL_TABLET | Freq: Every day | ORAL | 1 refills | Status: AC

## 2020-10-26 ENCOUNTER — Encounter: Payer: Self-pay | Admitting: Nurse Practitioner

## 2020-10-26 ENCOUNTER — Other Ambulatory Visit: Payer: Self-pay

## 2020-10-26 ENCOUNTER — Ambulatory Visit (INDEPENDENT_AMBULATORY_CARE_PROVIDER_SITE_OTHER): Payer: PPO | Admitting: Nurse Practitioner

## 2020-10-26 VITALS — BP 125/66 | HR 66 | Temp 98.2°F | Wt 207.6 lb

## 2020-10-26 DIAGNOSIS — E1159 Type 2 diabetes mellitus with other circulatory complications: Secondary | ICD-10-CM

## 2020-10-26 DIAGNOSIS — I152 Hypertension secondary to endocrine disorders: Secondary | ICD-10-CM

## 2020-10-26 NOTE — Progress Notes (Signed)
BP 125/66    Pulse 66    Temp 98.2 F (36.8 C) (Oral)    Wt 207 lb 9.6 oz (94.2 kg)    SpO2 97%    BMI 29.79 kg/m    Subjective:    Patient ID: Richard Russian Sr., male    DOB: [birth year], 69 y.o.   MRN: [id number]  HPI: Richard DUGAR Sr. is a 69 y.o. male presenting for blood pressure follow up.  Chief Complaint  Patient presents with   Hypertension   HYPERTENSION Currently taking losartan 100 mg, hydrochlorothiazide 20 mg, and amlodipine 5 mg daily for blood pressure. Hypertension status: controlled  Satisfied with current treatment? no Duration of hypertension: chronic BP monitoring frequency:  weekly BP range: 130s/70s-80s BP medication side effects:  no Medication compliance: excellent compliance Previous BP meds:  Aspirin: no Recurrent headaches: no Visual changes: no Palpitations: no Dyspnea: no Chest pain: no Lower extremity edema: yes -mild, has not been affecting quality of life. Dizzy/lightheaded: no  Is still having some leg weakness, especially in the afternoons.  Has been forgetting to cut rosuvastatin half, but plans to do this for next week's pill box.  Allergies  Allergen Reactions   Amlodipine Swelling   Outpatient Encounter Medications as of 10/26/2020  Medication Sig   amLODipine (NORVASC) 10 MG tablet Take 5 mg by mouth daily.   aspirin EC 81 MG tablet Take 81 mg by mouth daily.   Blood Glucose Monitoring Suppl (ONE TOUCH ULTRA 2) w/Device KIT    clonazePAM (KLONOPIN) 0.5 MG tablet Take 1 tablet (0.5 mg total) by mouth daily as needed for anxiety.   gabapentin (NEURONTIN) 300 MG capsule Take 1 capsule (300 mg total) by mouth 3 (three) times daily as needed.   glipiZIDE (GLUCOTROL XL) 10 MG 24 hr tablet Take 2 tablets (20 mg total) by mouth daily with breakfast.   hydrochlorothiazide (HYDRODIURIL) 25 MG tablet Take 1 tablet (25 mg total) by mouth daily.   Lancets (ONETOUCH DELICA PLUS [id number]) MISC    losartan (COZAAR) 100 MG tablet  Take 1 tablet (100 mg total) by mouth daily.   meclizine (ANTIVERT) 25 MG tablet Take 1 tablet (25 mg total) by mouth 3 (three) times daily as needed for dizziness.   metFORMIN (GLUCOPHAGE) 1000 MG tablet Take 1 tablet (1,000 mg total) by mouth 2 (two) times daily with a meal.   metoprolol succinate (TOPROL-XL) 25 MG 24 hr tablet Take 1 tablet (25 mg total) by mouth daily.   omeprazole (PRILOSEC) 20 MG capsule Take 1 capsule (20 mg total) by mouth daily.   ONETOUCH ULTRA test strip 1 each 2 (two) times daily.   rosuvastatin (CRESTOR) 20 MG tablet Take 1 tablet (20 mg total) by mouth daily.   No facility-administered encounter medications on file as of 10/26/2020.   Patient Active Problem List   Diagnosis Date Noted   Murmur, cardiac 09/29/2020   History of CVA (cerebrovascular accident) 12/09/2018   Anxiety 12/09/2018   Type 2 diabetes mellitus with neurological complications ([place]) 70/17/7939   Hyperlipidemia 12/06/2018   Hypertension associated with diabetes ([place]) 12/06/2018   Right thalamic infarction ([place]) 10/05/2017   TIA (transient ischemic attack) 08/30/2017   Past Medical History:  Diagnosis Date   Arthritis    Diabetes mellitus without complication (HCC)    GERD (gastroesophageal reflux disease)    Hemorrhoid    Hyperlipidemia    Hypertension    Stroke [facility])    Relevant past medical, surgical, family  and social history reviewed and updated as indicated. Interim medical history since our last visit reviewed.  Review of Systems  Constitutional: Negative.  Negative for activity change, appetite change, fatigue and fever.  Eyes: Negative.  Negative for visual disturbance.  Respiratory: Negative.  Negative for chest tightness, shortness of breath and wheezing.   Cardiovascular: Positive for leg swelling. Negative for chest pain and palpitations.  Gastrointestinal: Negative.   Musculoskeletal: Negative.   Skin: Negative.   Neurological: Negative.   Negative for dizziness, weakness and headaches.  Psychiatric/Behavioral: Negative.     Per HPI unless specifically indicated above     Objective:    BP 125/66    Pulse 66    Temp 98.2 F (36.8 C) (Oral)    Wt 207 lb 9.6 oz (94.2 kg)    SpO2 97%    BMI 29.79 kg/m   Wt Readings from Last 3 Encounters:  10/26/20 207 lb 9.6 oz (94.2 kg)  09/28/20 209 lb 3.2 oz (94.9 kg)  06/17/20 206 lb (93.4 kg)    Physical Exam Vitals and nursing note reviewed.  Constitutional:      General: He is not in acute distress.    Appearance: Normal appearance. He is obese.  Eyes:     General: No scleral icterus.    Extraocular Movements: Extraocular movements intact.  Neck:     Vascular: No carotid bruit.  Cardiovascular:     Rate and Rhythm: Normal rate.     Heart sounds: Murmur heard.   Pulmonary:     Effort: Pulmonary effort is normal. No respiratory distress.     Breath sounds: Normal breath sounds. No wheezing or rhonchi.  Abdominal:     General: Abdomen is flat. Bowel sounds are normal. There is no distension.  Musculoskeletal:        General: Normal range of motion.     Cervical back: Normal range of motion.     Right lower leg: No edema.     Left lower leg: No edema.  Skin:    General: Skin is warm and dry.     Coloration: Skin is not jaundiced or pale.  Neurological:     General: No focal deficit present.     Mental Status: He is alert and oriented to person, place, and time.     Motor: No weakness.     Gait: Gait normal.  Psychiatric:        Mood and Affect: Mood normal.        Behavior: Behavior normal.        Thought Content: Thought content normal.        Judgment: Judgment normal.     Results for orders placed or performed in visit on 09/28/20  HgB A1c  Result Value Ref Range   Hgb A1c MFr Bld 9.6 (H) 4.8 - 5.6 %   Est. average glucose Bld gHb Est-mCnc 229 mg/dL  Comprehensive metabolic panel  Result Value Ref Range   Glucose 222 (H) 65 - 99 mg/dL   BUN 17 8 - 27  mg/dL   Creatinine, Ser 1.23 0.76 - 1.27 mg/dL   GFR calc non Af Amer 59 (L) >59 mL/min/1.73   GFR calc Af Amer 68 >59 mL/min/1.73   BUN/Creatinine Ratio 14 10 - 24   Sodium 136 134 - 144 mmol/L   Potassium 4.4 3.5 - 5.2 mmol/L   Chloride 96 96 - 106 mmol/L   CO2 25 20 - 29 mmol/L   Calcium 9.7 8.6 -  10.2 mg/dL   Total Protein 7.3 6.0 - 8.5 g/dL   Albumin 4.8 (H) 3.7 - 4.7 g/dL   Globulin, Total 2.5 1.5 - 4.5 g/dL   Albumin/Globulin Ratio 1.9 1.2 - 2.2   Bilirubin Total 0.5 0.0 - 1.2 mg/dL   Alkaline Phosphatase 63 44 - 121 IU/L   AST 16 0 - 40 IU/L   ALT 24 0 - 44 IU/L  CBC with Differential/Platelet  Result Value Ref Range   WBC 6.4 3.4 - 10.8 x10E3/uL   RBC 4.99 4.14 - 5.80 x10E6/uL   Hemoglobin 14.8 13.0 - 17.7 g/dL   Hematocrit 44.6 37.5 - 51.0 %   MCV 89 79 - 97 fL   MCH 29.7 26.6 - 33.0 pg   MCHC 33.2 31 - 35 g/dL   RDW 12.2 11.6 - 15.4 %   Platelets 165 150 - 450 x10E3/uL   Neutrophils 67 Not Estab. %   Lymphs 22 Not Estab. %   Monocytes 8 Not Estab. %   Eos 2 Not Estab. %   Basos 1 Not Estab. %   Neutrophils Absolute 4.4 1.40 - 7.00 x10E3/uL   Lymphocytes Absolute 1.4 0 - 3 x10E3/uL   Monocytes Absolute 0.5 0 - 0 x10E3/uL   EOS (ABSOLUTE) 0.1 0.0 - 0.4 x10E3/uL   Basophils Absolute 0.1 0 - 0 x10E3/uL   Immature Granulocytes 0 Not Estab. %   Immature Grans (Abs) 0.0 0.0 - 0.1 x10E3/uL  Lipid Panel w/o Chol/HDL Ratio  Result Value Ref Range   Cholesterol, Total 114 100 - 199 mg/dL   Triglycerides 127 0 - 149 mg/dL   HDL 34 (L) >39 mg/dL   VLDL Cholesterol Cal 23 5 - 40 mg/dL   LDL Chol Calc (NIH) 57 0 - 99 mg/dL      Assessment & Plan:   Problem List Items Addressed This Visit      Cardiovascular and Mediastinum   Hypertension associated with diabetes ([place]) - Primary    Chronic, stable.  Blood pressure at goal today in office.  We will continue current regimen with losartan 100 mg, amlodipine 5 mg, and hydrochlorothiazide 25 mg.  Encouraged patient to  continue to monitor blood pressure at home and notify clinic if greater than 140/90.  Follow-up in 3 months or sooner if needs arise.      Relevant Medications   amLODipine (NORVASC) 10 MG tablet       Follow up plan: Return in about 3 months (around 01/26/2021) for HTN, HLD, DM f/u.

## 2020-10-26 NOTE — Assessment & Plan Note (Signed)
Chronic, stable.  Blood pressure at goal today in office.  We will continue current regimen with losartan 100 mg, amlodipine 5 mg, and hydrochlorothiazide 25 mg.  Encouraged patient to continue to monitor blood pressure at home and notify clinic if greater than 140/90.  Follow-up in 3 months or sooner if needs arise.

## 2020-10-26 NOTE — Patient Instructions (Signed)
DASH Eating Plan DASH stands for "Dietary Approaches to Stop Hypertension." The DASH eating plan is a healthy eating plan that has been shown to reduce high blood pressure (hypertension). It may also reduce your risk for type 2 diabetes, heart disease, and stroke. The DASH eating plan may also help with weight loss. What are tips for following this plan?  General guidelines  Avoid eating more than 2,300 mg (milligrams) of salt (sodium) a day. If you have hypertension, you may need to reduce your sodium intake to 1,500 mg a day.  Limit alcohol intake to no more than 1 drink a day for nonpregnant women and 2 drinks a day for men. One drink equals 12 oz of beer, 5 oz of wine, or 1 oz of hard liquor.  Work with your health care provider to maintain a healthy body weight or to lose weight. Ask what an ideal weight is for you.  Get at least 30 minutes of exercise that causes your heart to beat faster (aerobic exercise) most days of the week. Activities may include walking, swimming, or biking.  Work with your health care provider or diet and nutrition specialist (dietitian) to adjust your eating plan to your individual calorie needs. Reading food labels   Check food labels for the amount of sodium per serving. Choose foods with less than 5 percent of the Daily Value of sodium. Generally, foods with less than 300 mg of sodium per serving fit into this eating plan.  To find whole grains, look for the word "whole" as the first word in the ingredient list. Shopping  Buy products labeled as "low-sodium" or "no salt added."  Buy fresh foods. Avoid canned foods and premade or frozen meals. Cooking  Avoid adding salt when cooking. Use salt-free seasonings or herbs instead of table salt or sea salt. Check with your health care provider or pharmacist before using salt substitutes.  Do not fry foods. Cook foods using healthy methods such as baking, boiling, grilling, and broiling instead.  Cook with  heart-healthy oils, such as olive, canola, soybean, or sunflower oil. Meal planning  Eat a balanced diet that includes: ? 5 or more servings of fruits and vegetables each day. At each meal, try to fill half of your plate with fruits and vegetables. ? Up to 6-8 servings of whole grains each day. ? Less than 6 oz of lean meat, poultry, or fish each day. A 3-oz serving of meat is about the same size as a deck of cards. One egg equals 1 oz. ? 2 servings of low-fat dairy each day. ? A serving of nuts, seeds, or beans 5 times each week. ? Heart-healthy fats. Healthy fats called Omega-3 fatty acids are found in foods such as flaxseeds and coldwater fish, like sardines, salmon, and mackerel.  Limit how much you eat of the following: ? Canned or prepackaged foods. ? Food that is high in trans fat, such as fried foods. ? Food that is high in saturated fat, such as fatty meat. ? Sweets, desserts, sugary drinks, and other foods with added sugar. ? Full-fat dairy products.  Do not salt foods before eating.  Try to eat at least 2 vegetarian meals each week.  Eat more home-cooked food and less restaurant, buffet, and fast food.  When eating at a restaurant, ask that your food be prepared with less salt or no salt, if possible. What foods are recommended? The items listed may not be a complete list. Talk with your dietitian about   what dietary choices are best for you. Grains Whole-grain or whole-wheat bread. Whole-grain or whole-wheat pasta. Brown rice. Oatmeal. Quinoa. Bulgur. Whole-grain and low-sodium cereals. Pita bread. Low-fat, low-sodium crackers. Whole-wheat flour tortillas. Vegetables Fresh or frozen vegetables (raw, steamed, roasted, or grilled). Low-sodium or reduced-sodium tomato and vegetable juice. Low-sodium or reduced-sodium tomato sauce and tomato paste. Low-sodium or reduced-sodium canned vegetables. Fruits All fresh, dried, or frozen fruit. Canned fruit in natural juice (without  added sugar). Meat and other protein foods Skinless chicken or turkey. Ground chicken or turkey. Pork with fat trimmed off. Fish and seafood. Egg whites. Dried beans, peas, or lentils. Unsalted nuts, nut butters, and seeds. Unsalted canned beans. Lean cuts of beef with fat trimmed off. Low-sodium, lean deli meat. Dairy Low-fat (1%) or fat-free (skim) milk. Fat-free, low-fat, or reduced-fat cheeses. Nonfat, low-sodium ricotta or cottage cheese. Low-fat or nonfat yogurt. Low-fat, low-sodium cheese. Fats and oils Soft margarine without trans fats. Vegetable oil. Low-fat, reduced-fat, or light mayonnaise and salad dressings (reduced-sodium). Canola, safflower, olive, soybean, and sunflower oils. Avocado. Seasoning and other foods Herbs. Spices. Seasoning mixes without salt. Unsalted popcorn and pretzels. Fat-free sweets. What foods are not recommended? The items listed may not be a complete list. Talk with your dietitian about what dietary choices are best for you. Grains Baked goods made with fat, such as croissants, muffins, or some breads. Dry pasta or rice meal packs. Vegetables Creamed or fried vegetables. Vegetables in a cheese sauce. Regular canned vegetables (not low-sodium or reduced-sodium). Regular canned tomato sauce and paste (not low-sodium or reduced-sodium). Regular tomato and vegetable juice (not low-sodium or reduced-sodium). Pickles. Olives. Fruits Canned fruit in a light or heavy syrup. Fried fruit. Fruit in cream or butter sauce. Meat and other protein foods Fatty cuts of meat. Ribs. Fried meat. Bacon. Sausage. Bologna and other processed lunch meats. Salami. Fatback. Hotdogs. Bratwurst. Salted nuts and seeds. Canned beans with added salt. Canned or smoked fish. Whole eggs or egg yolks. Chicken or turkey with skin. Dairy Whole or 2% milk, cream, and half-and-half. Whole or full-fat cream cheese. Whole-fat or sweetened yogurt. Full-fat cheese. Nondairy creamers. Whipped toppings.  Processed cheese and cheese spreads. Fats and oils Butter. Stick margarine. Lard. Shortening. Ghee. Bacon fat. Tropical oils, such as coconut, palm kernel, or palm oil. Seasoning and other foods Salted popcorn and pretzels. Onion salt, garlic salt, seasoned salt, table salt, and sea salt. Worcestershire sauce. Tartar sauce. Barbecue sauce. Teriyaki sauce. Soy sauce, including reduced-sodium. Steak sauce. Canned and packaged gravies. Fish sauce. Oyster sauce. Cocktail sauce. Horseradish that you find on the shelf. Ketchup. Mustard. Meat flavorings and tenderizers. Bouillon cubes. Hot sauce and Tabasco sauce. Premade or packaged marinades. Premade or packaged taco seasonings. Relishes. Regular salad dressings. Where to find more information:  National Heart, Lung, and Blood Institute: www.nhlbi.nih.gov  American Heart Association: www.heart.org Summary  The DASH eating plan is a healthy eating plan that has been shown to reduce high blood pressure (hypertension). It may also reduce your risk for type 2 diabetes, heart disease, and stroke.  With the DASH eating plan, you should limit salt (sodium) intake to 2,300 mg a day. If you have hypertension, you may need to reduce your sodium intake to 1,500 mg a day.  When on the DASH eating plan, aim to eat more fresh fruits and vegetables, whole grains, lean proteins, low-fat dairy, and heart-healthy fats.  Work with your health care provider or diet and nutrition specialist (dietitian) to adjust your eating plan to your   individual calorie needs. This information is not intended to replace advice given to you by your health care provider. Make sure you discuss any questions you have with your health care provider. Document Revised: 11/16/2017 Document Reviewed: 11/27/2016 Elsevier Patient Education  2020 Elsevier Inc.  

## 2020-11-01 ENCOUNTER — Ambulatory Visit (INDEPENDENT_AMBULATORY_CARE_PROVIDER_SITE_OTHER): Payer: PPO

## 2020-11-01 ENCOUNTER — Other Ambulatory Visit: Payer: Self-pay

## 2020-11-01 DIAGNOSIS — R011 Cardiac murmur, unspecified: Secondary | ICD-10-CM

## 2020-11-01 LAB — ECHOCARDIOGRAM COMPLETE
Area-P 1/2: 2.03 cm2
Calc EF: 60.8 %
P 1/2 time: 445 msec
S' Lateral: 3 cm
Single Plane A2C EF: 52.7 %
Single Plane A4C EF: 68.7 %

## 2020-11-02 ENCOUNTER — Encounter: Payer: Self-pay | Admitting: Nurse Practitioner

## 2020-12-07 DIAGNOSIS — M1712 Unilateral primary osteoarthritis, left knee: Secondary | ICD-10-CM | POA: Diagnosis not present

## 2020-12-07 DIAGNOSIS — M25562 Pain in left knee: Secondary | ICD-10-CM | POA: Diagnosis not present

## 2020-12-24 DIAGNOSIS — H905 Unspecified sensorineural hearing loss: Secondary | ICD-10-CM | POA: Diagnosis not present

## 2020-12-24 DIAGNOSIS — H9042 Sensorineural hearing loss, unilateral, left ear, with unrestricted hearing on the contralateral side: Secondary | ICD-10-CM | POA: Diagnosis not present

## 2020-12-24 DIAGNOSIS — R222 Localized swelling, mass and lump, trunk: Secondary | ICD-10-CM | POA: Diagnosis not present

## 2020-12-24 DIAGNOSIS — H903 Sensorineural hearing loss, bilateral: Secondary | ICD-10-CM | POA: Diagnosis not present

## 2020-12-24 DIAGNOSIS — R42 Dizziness and giddiness: Secondary | ICD-10-CM | POA: Diagnosis not present

## 2020-12-30 ENCOUNTER — Other Ambulatory Visit: Payer: Self-pay | Admitting: Family Medicine

## 2020-12-30 NOTE — Telephone Encounter (Signed)
  Notes to clinic:  medication last filled by Merrie Roof Last provider seen is not at practice anymore Review for refill    Requested Prescriptions  Pending Prescriptions Disp Refills   omeprazole (PRILOSEC) 20 MG capsule [Pharmacy Med Name: OMEPRAZOLE DR 20 MG CAPSULE] 90 capsule 0    Sig: Take 1 capsule (20 mg total) by mouth daily.      Gastroenterology: Proton Pump Inhibitors Passed - 12/30/2020  9:57 AM      Passed - Valid encounter within last 12 months    Recent Outpatient Visits           2 months ago Hypertension associated with diabetes [facility])   [facility] Eulogio Bear, NP   3 months ago Type 2 diabetes mellitus with neurological complications [facility])   [place], Jessica A, NP   6 months ago Type 2 diabetes mellitus with neurological complications [facility])   [facility] Volney American, [place]   8 months ago Nausea   [facility] Merrie Roof [place], [place]   9 months ago Type 2 diabetes mellitus with neurological complications [facility])   [facility] Volney American, [place]       Future Appointments             In 2 months [facility], [place]

## 2020-12-30 NOTE — Telephone Encounter (Signed)
   Notes to clinic:  medication last filled by [address] Last provider seen in not at practice anymore  Review for refill    Requested Prescriptions  Pending Prescriptions Disp Refills   omeprazole (PRILOSEC) 20 MG capsule [Pharmacy Med Name: OMEPRAZOLE DR 20 MG CAPSULE] 90 capsule 0    Sig: Take 1 capsule (20 mg total) by mouth daily.      Gastroenterology: Proton Pump Inhibitors Passed - 12/30/2020  4:56 PM      Passed - Valid encounter within last 12 months    Recent Outpatient Visits           2 months ago Hypertension associated with diabetes [facility])   [facility] Eulogio Bear, NP   3 months ago Type 2 diabetes mellitus with neurological complications [facility])   [place], Jessica A, NP   6 months ago Type 2 diabetes mellitus with neurological complications [facility])   [facility] Volney American, [place]   8 months ago Nausea   [facility] Merrie Roof [place], [place]   9 months ago Type 2 diabetes mellitus with neurological complications [facility])   [facility] Volney American, [place]       Future Appointments             In 2 months [facility], [place]

## 2020-12-31 NOTE — Telephone Encounter (Signed)
Patient last seen by Janett Billow in November, previous [facility] patient.

## 2021-01-27 DIAGNOSIS — F411 Generalized anxiety disorder: Secondary | ICD-10-CM | POA: Diagnosis not present

## 2021-01-27 DIAGNOSIS — R42 Dizziness and giddiness: Secondary | ICD-10-CM | POA: Diagnosis not present

## 2021-01-27 DIAGNOSIS — D1779 Benign lipomatous neoplasm of other sites: Secondary | ICD-10-CM | POA: Diagnosis not present

## 2021-01-27 DIAGNOSIS — I639 Cerebral infarction, unspecified: Secondary | ICD-10-CM | POA: Diagnosis not present

## 2021-02-03 ENCOUNTER — Other Ambulatory Visit: Payer: Self-pay | Admitting: Family Medicine

## 2021-02-03 ENCOUNTER — Telehealth: Payer: Self-pay | Admitting: Family Medicine

## 2021-02-03 NOTE — Telephone Encounter (Signed)
Requested medication (s) are due for refill today: yes  Requested medication (s) are on the active medication list: yes  Last refill:  09/29/20  Future visit scheduled: no  Notes to clinic:  not delegated   Requested Prescriptions  Pending Prescriptions Disp Refills   clonazePAM (KLONOPIN) 0.5 MG tablet [Pharmacy Med Name: CLONAZEPAM 0.5 MG TABLET] 20 tablet 0    Sig: Take 1 tablet (0.5 mg total) by mouth daily as needed for anxiety.      Not Delegated - Psychiatry:  Anxiolytics/Hypnotics Failed - 02/03/2021 10:04 AM      Failed - This refill cannot be delegated      Failed - Urine Drug Screen completed in last 360 days      Passed - Valid encounter within last 6 months    Recent Outpatient Visits           3 months ago Hypertension associated with diabetes [facility])   [facility] Eulogio Bear, NP   4 months ago Type 2 diabetes mellitus with neurological complications [facility])   [place], Jessica A, NP   7 months ago Type 2 diabetes mellitus with neurological complications [facility])   [place], [place], [place]   9 months ago Nausea   [facility] Merrie Roof [place], [place]   10 months ago Type 2 diabetes mellitus with neurological complications [facility])   [facility] Volney American, [place]       Future Appointments             In 1 month [facility], [facility]

## 2021-02-03 NOTE — Telephone Encounter (Signed)
Patient is overdue for follow up.  Refill should have lasted at least 6 months per previous providers note.  Patient will likely need to start daily medication if Klonopin is being used this often.

## 2021-02-03 NOTE — Telephone Encounter (Signed)
   Notes to clinic:  medication was filled by a  historical provider  Review for refill    Requested Prescriptions  Pending Prescriptions Disp Refills   amLODipine (NORVASC) 10 MG tablet [Pharmacy Med Name: AMLODIPINE BESYLATE 10 MG TAB] 90 tablet 0    Sig: Take 1 tablet (10 mg total) by mouth daily.      Cardiovascular:  Calcium Channel Blockers Passed - 02/03/2021 10:04 AM      Passed - Last BP in normal range    BP Readings from Last 1 Encounters:  10/26/20 125/66          Passed - Valid encounter within last 6 months    Recent Outpatient Visits           3 months ago Hypertension associated with diabetes [facility])   [facility] Eulogio Bear, NP   4 months ago Type 2 diabetes mellitus with neurological complications [facility])   Francisco, Jessica A, NP   7 months ago Type 2 diabetes mellitus with neurological complications [facility])   [place], [place], [place]   9 months ago Nausea   [facility] [place], [place]   10 months ago Type 2 diabetes mellitus with neurological complications [facility])   [facility] [place], [place]       Future Appointments             In 1 month [facility], [facility]

## 2021-02-08 ENCOUNTER — Other Ambulatory Visit: Payer: Self-pay | Admitting: Nurse Practitioner

## 2021-02-08 NOTE — Telephone Encounter (Signed)
VM not set up unable to reach pt

## 2021-02-08 NOTE — Telephone Encounter (Signed)
Pt is calling to sch an appt with karen for med follow up

## 2021-02-09 NOTE — Telephone Encounter (Signed)
Please see about getting patient scheduled.  

## 2021-02-09 NOTE — Telephone Encounter (Signed)
Please see other message have been trying to contact pt no success.

## 2021-02-09 NOTE — Telephone Encounter (Signed)
Pt stated he no longer is a patient at [facility].

## 2021-02-09 NOTE — Telephone Encounter (Signed)
Pt stated he no longer is a patient at [facility].

## 2021-02-14 ENCOUNTER — Ambulatory Visit: Payer: PPO

## 2021-02-21 ENCOUNTER — Other Ambulatory Visit: Payer: Self-pay

## 2021-02-21 ENCOUNTER — Encounter: Payer: Self-pay | Admitting: Physical Therapy

## 2021-02-21 ENCOUNTER — Ambulatory Visit: Payer: PPO | Attending: Neurology | Admitting: Physical Therapy

## 2021-02-21 DIAGNOSIS — R2689 Other abnormalities of gait and mobility: Secondary | ICD-10-CM | POA: Diagnosis not present

## 2021-02-21 DIAGNOSIS — R42 Dizziness and giddiness: Secondary | ICD-10-CM | POA: Insufficient documentation

## 2021-02-21 DIAGNOSIS — R2681 Unsteadiness on feet: Secondary | ICD-10-CM | POA: Insufficient documentation

## 2021-02-21 NOTE — Therapy (Signed)
[place] MAIN [facility] SERVICES [address] [place], [place], [address] Phone: [phone]   Fax:  [phone]  Physical Therapy Evaluation  Patient Details  Name: Richard WALKOWSKI Sr. MRN: [id number] Date of Birth: [birth year] Referring Provider (PT): Dr. Manuella Ghazi   Encounter Date: 02/21/2021   PT End of Session - 02/21/21 0955    Visit Number 1    Number of Visits 25    Date for PT Re-Evaluation 05/16/21    PT Start Time 0804    PT Stop Time 0910    PT Time Calculation (min) 66 min    Equipment Utilized During Treatment Gait belt    Activity Tolerance Patient tolerated treatment well    Behavior During Therapy [facility] for tasks assessed/performed           Past Medical History:  Diagnosis Date  . Arthritis   . Diabetes mellitus without complication ([place])   . GERD (gastroesophageal reflux disease)   . Hemorrhoid   . Hyperlipidemia   . Hypertension   . Stroke [facility])     Past Surgical History:  Procedure Laterality Date  . ANAL FISTULOTOMY N/A 04/30/2015   Procedure: ANAL FISTULOTOMY;  Surgeon: Christene Lye, MD;  Location: [facility] ORS;  Service: General;  Laterality: N/A;  . COLONOSCOPY N/A 04/30/2015   Procedure: COLONOSCOPY;  Surgeon: Christene Lye, MD;  Location: [facility] ORS;  Service: General;  Laterality: N/A;  . TONSILLECTOMY      There were no vitals filed for this visit.    Subjective Assessment - 02/21/21 1039    Subjective Patient reports that he has been getting motion sickness when he rolls over in bed, when he tries to go fishing on a boat and sometimes when he is driving and comes to a quick stop. Patient reports that he has had dizziness for the past 8-10 years, but that it has gotten worse in the past 2 years.    Pertinent History Pt suffered a right thalamic infarct on 08/30/2017 with left sided weakness numbness. Pt reports he still has some decreased sensation on the left side of his face and left hand. Pt reports  that he lost his ability to taste after the stroke, but that this has improved somewhat. Pt states that he has difficulty holding a cup of coffee in his left hand and that he feels like when he is walking that he sometime notices that he is stepping down hard with left leg as compared to right. Pt reports he has had dizziness for the past 8-10 years, but reports it has gotten worse in the past 2 years. Pt states that he does not get spinning, but that he has been having motion sickness. Pt reports that sometimes when he is driving if he puts on the brakes quickly, he breaks out in a sweat due to dizziness. Pt states that he enjoys fishing, but that he has not been able to go out in the boat because of motion sickness. Pt states that he is all right when the boat is moving, but once the boat stops he gets the sensation of continued movement/rocking and states that it makes him sick to his stomach and he has had episodes of vomiting due to this sensation. Pt states he had mild sea sickness history, but that this has worsened in the past year. Pt states that he can lie down and if he turns over quickly, he gets feeling like he is going to get sick,  but denies vertigo. Pt reports that riding in a boat and car that he gets motion sickness upon stopping, rolling over in bed, bending over, looking up and quick movements can all bring on his symptoms. Pt reports that sitting still and resting as well as Meclizine help to decrease his symptoms. Pt denies vertigo but rather describes his dizziness as unsteadiness, motion sickness and a sensation of aural fullness. Patient's symptoms are motion provoked and intermittent. Pt reports he is getting episodes of dizziness twice a day which last seconds. Pt states he stays still it goes away. Pt reports that he used to have panic and anxiety, but it is better now. Pt reports that he gets headaches about once a week or less. Pt reports no changes in his headaches in the past year. Pt  reports he gets a dull pain and denies any history of migraines. Patient has been seen by PA at ENT physician's office on 12/24/20 with negative ENT evaluation per medical record. Patient states that he Dix-Hallpike testing which was negative. Patient denies having had VNG testing. Per medical record, patient's dizziness is unlikely a peripheral vestibulopathy.    Patient Stated Goals to be able to walk without veering, to be able to go fishing on a boat and to have decreased motion sickness/dizziness    Currently in Pain? --   none stated             [facility] PT Assessment - 02/21/21 0956      Assessment   Medical Diagnosis Dizziness    Referring Provider (PT) Dr. Manuella Ghazi    Hand Dominance Right    Prior Therapy no prior vestibular therapy      Precautions   Precautions Fall      Restrictions   Weight Bearing Restrictions No      Balance Screen   Has the patient fallen in the past 6 months No    Has the patient had a decrease in activity level because of a fear of falling?  No    Is the patient reluctant to leave their home because of a fear of falling?  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 Status Within Functional Limits for tasks assessed      Observation/Other Assessments   Focus on Therapeutic Outcomes (FOTO)  52/100      Standardized Balance Assessment   Standardized Balance Assessment Dynamic Gait Index      Dynamic Gait Index   Level Surface Normal    Change in Gait Speed Mild Impairment    Gait with Horizontal Head Turns Mild Impairment    Gait with Vertical Head Turns Mild Impairment    Gait and Pivot Turn Normal    Step Over Obstacle Moderate Impairment    Step Around Obstacles Normal    Steps Mild Impairment    Total Score 18            VESTIBULAR AND BALANCE EVALUATION   HISTORY:  Subjective history of current  problem: History obtained from patient and medical record. Patient suffered a right thalamic infarct on 08/30/2017 with left sided weakness and numbness. Patient reports he still has some decreased sensation on the left side of his face and left hand. Patient reports that he lost his ability to taste after the stroke, but that  this has improved somewhat. Patient states that he has difficulty holding a cup of coffee in his left hand and that he feels like when he is walking that he sometime notices that he is stepping down hard with left leg as compared to right. Patient reports he has had dizziness for the past 8-10 years, but reports it has gotten worse in the past 2 years. Patient states that he does not get spinning, but that he has been having motion sickness. Patient reports that sometimes when he is driving if he puts on the brakes quickly, he breaks out in a sweat due to dizziness. Patient states that he enjoys fishing, but that he has not been able to go out in the boat because of motion sickness. Patient states that he is all right when the boat is moving, but once the boat stops he gets the sensation of continued movement/rocking and states that it makes him sick to his stomach and he has had episodes of vomiting due to this sensation. Patient states he had mild sea sickness history, but that this has worsened in the past year. Patient states that he can lie down and if he turns over quickly, he gets feeling like he is going to get sick, but denies vertigo. Patient reports that riding in a boat and car that he gets motion sickness upon stopping, rolling over in bed, bending over, looking up and quick movements can all bring on his symptoms. Patient reports that sitting still and resting as well as Meclizine help to decrease his symptoms. Patient denies vertigo but rather describes his dizziness as unsteadiness, motion sickness and a sensation of aural fullness. Patient's symptoms are motion provoked and  intermittent. Patient reports he is getting episodes of dizziness twice a day which last seconds. Patient states he stays still it goes away. Patient reports that he used to have panic and anxiety, but it is better now. Patient reports that he gets headaches about once a week or less. Patient reports no changes in his headaches in the past year. Patient reports he gets a dull pain and denies any history of migraines. Patient has been seen by PA at ENT physician's office on 12/24/20 with negative ENT evaluation per medical record. Patient states that he Dix-Hallpike testing which was negative. Patient denies having had VNG testing. Per medical record, patient's dizziness is unlikely a peripheral vestibulopathy. Patient reports that he has a lot of sinus drainage. Patient reports that he was referred from ENT to Dr. Manuella Ghazi, neurologist.    Description of dizziness:  unsteadiness, motion sickness feeling, aural fullness Symptom nature: motion provoked, intermittent Progression of symptoms:  worse  Falls (yes/no): no Number of falls in past 6 months:   Auditory complaints (tinnitus, pain, drainage): reports tinnitus occasionally Vision (last eye exam, diplopia, recent changes): denies changes to vision  Current Symptoms: (dysarthria, dysphagia, drop attacks, bowel and bladder changes, recent weight loss/gain)    Review of systems negative for red flags.    EXAMINATION   SOMATOSENSORY:  Any N & T in extremities or weakness: left side of face and left hand; states he lost his taste after the stroke and reports some of the sensation came back.       COORDINATION: Finger to Nose: Dysmetric left Past Pointing:  Left  moderate  MUSCULOSKELETAL SCREEN: Cervical Spine ROM: AROM cervical spine WNL without pain.   Functional Mobility: Independent with sit to/from transfers  Gait: Patient arrives ambulating without AD. Patient ambulates with decreased  cadence and noted to have mild sway/veering when  turning head while ambulating.  Scanning of visual environment with gait is: fair  Patient reports his left leg is weak at times, but reports that he does not use an AD.   Balance: Patient is challenged by activities with eyes closed, narrow base of support, uneven surfaces, ambulation with head and body turns.   POSTURAL CONTROL TESTS:  Clinical Test of Sensory Interaction for Balance (CTSIB):  CONDITION TIME STRATEGY SWAY  Eyes open, firm surface 30 seconds ankle +1  Eyes closed, firm surface 30 seconds ankle +2  Eyes open, foam surface 30 seconds Ankle, hip +2  Eyes closed, foam surface 5 seconds Ankle, reaching +4    OCULOMOTOR / VESTIBULAR TESTING: Oculomotor Exam- Room Light  Normal Abnormal Comments  Ocular Alignment N    Ocular ROM N    Spontaneous Nystagmus N    Gaze evoked Nystagmus N    Smooth Pursuit N    Saccades  Abn Hypometric saccades noted  VOR  Abn Reports dizziness  VOR Cancellation N    Left Head Impulse   deferred  Right Head Impulse   deferred  Head Shaking Nystagmus   deferred    BPPV TESTS:  Symptoms Duration Intensity Nystagmus  Left Dix-Hallpike None N/A N/A None observed  Right Dix-Hallpike None N/A N/A None observed  Left Head Roll Reports mild dizziness seconds  None observed  Right Head Roll None N/A N/A None observed   FUNCTIONAL OUTCOME MEASURES:  Results Comments  DHI  deferred  ABC Scale  deferred  DGI 18/24 Falls risk; in need of intervention  FOTO 52/100 Given the patient's risk adjustment variables, like-patients nationally had a FS score of 57/100 at intake   Neuromuscular Re-education:  VOR X 1 exercise:  Demonstrated and educated as to VOR X1.  Patient performed VOR X 1 horizontal in sitting 1 rep of 30 seconds and 2 reps of 1 minute each with verbal cues for technique.  Patient reports 2-3/10 dizziness.  Issued VOR X 1 in sitting 1 minute reps for HEP.     PT Education - 02/21/21 0954    Education Details discussed plan  of care and goals; issued VOR X 1 in sitting for HEP via [place]    Person(s) Educated Patient    Methods Explanation;Demonstration;Verbal cues;Handout    Comprehension Verbalized understanding;Returned demonstration            PT Short Term Goals - 02/21/21 1008      PT SHORT TERM GOAL #1   Title Pt will be independent with HEP in order to improve balance and decrease dizziness symptoms in order to decrease fall risk and improve function at home and work.    Time 4    Period Weeks    Status New    Target Date 03/21/21             PT Long Term Goals - 02/21/21 1005      PT LONG TERM GOAL #1   Title Patient will demonstrate reduced falls risk as evidenced by Dynamic Gait Index (DGI) 21/24 or greater.    Baseline scored 18/24 on 02/21/21;    Time 12    Period Weeks    Status New    Target Date 05/16/21      PT LONG TERM GOAL #2   Title Patient will have improved FOTO score of 6 points or greater in order to demonstrate improvements in patient's ADLs and functional performance.  Baseline scored 52/100 on 02/21/21    Time 12    Period Weeks    Status New    Target Date 05/16/21      PT LONG TERM GOAL #3   Title Patient will report 50% or greater improvement in his symptoms of dizziness and imbalance with provoking motions or positions in order to be able to go fishing.    Baseline patient reports he has not been able to go fishing on a boat as he gets N & V upon the boat stopping movement;    Time 12    Period Weeks    Status New    Target Date 05/16/21      PT LONG TERM GOAL #4   Title Patient will be able to ambulate with head turns without veering or loss of balance in order for patient to safely ambulate about his home and in the community.    Baseline pt reports unsteadiness and noted mild veering with head turns horizontal and vertical on 02/21/21;    Time 12    Period Weeks    Status New    Target Date 05/16/21                  Plan - 02/21/21 1045     Clinical Impression Statement Patient presents to the clinic with reports of motion sickness and unsteadiness that has worsened in the past 2 years and that has been present for 8-10 years. Patient denies vertigo and had negative dix-Hallpike and horizontal canal testing this date. Patient scored 18/24 on the DGI indicating falls risk and scored 52/100 on FOTO indicating moderate difficulty with functional tasks. Patient with abnormal VOR and saccade testing this date. Patient demonstrates mild veering with ambulation with head turns and has difficulty with high level balance activities. Patient would benefit from vestibular PT services to try to address functional deficits and goals as set on plan of care.    Personal Factors and Comorbidities Comorbidity 3+;Time since onset of injury/illness/exacerbation    Comorbidities HTN, CVA, DM, hyperlipidemia, generalized anxiety disorder wtih panic attacks    Examination-Activity Limitations [place]    Examination-Participation Restrictions Other   fishing on a boat   Stability/Clinical Decision Making Evolving/Moderate complexity    Clinical Decision Making Moderate    Rehab Potential Good    PT Frequency 2x / week    PT Duration 12 weeks    PT Treatment/Interventions Canalith Repostioning;Gait training;Stair training;Therapeutic exercise;Therapeutic activities;Balance training;Neuromuscular re-education;Patient/family education;Vestibular;ADLs/Self Care Home Management    PT Next Visit Plan review VOR and progress as able, consider performing head thrust testing and DHI and ABC scale, work on activities with head turns    PT Home Exercise Plan VOR X 1 in sitting with plain background 1 minute reps times 3, 3 times a day; HEP via [place] and Agree with Plan of Care Patient           Patient will benefit from skilled therapeutic intervention in order to improve the following deficits and impairments:  Decreased balance,Difficulty  walking,Impaired sensation,Dizziness,Decreased strength,Decreased coordination  Visit Diagnosis: Dizziness and giddiness - Plan: PT plan of care cert/re-cert  Unsteadiness on feet - Plan: PT plan of care cert/re-cert     Problem List Patient Active Problem List   Diagnosis Date Noted  . Murmur, cardiac 09/29/2020  . History of CVA (cerebrovascular accident) 12/09/2018  . Anxiety 12/09/2018  . Type 2 diabetes mellitus with neurological complications ([place]) 67/11/4579  .  Hyperlipidemia 12/06/2018  . Hypertension associated with diabetes ([place]) 12/06/2018  . Right thalamic infarction ([place]) 10/05/2017  . TIA (transient ischemic attack) 08/30/2017   Lady Deutscher PT, DPT [phone] Lady Deutscher 02/21/2021, 10:57 AM  [place] MAIN [facility] [address] [place], [place], [address] Phone: [phone]   Fax:  [phone]  Name: Richard TEALL Sr. MRN: [id number] Date of Birth: 11/21/1951

## 2021-02-23 ENCOUNTER — Other Ambulatory Visit: Payer: Self-pay

## 2021-02-23 ENCOUNTER — Ambulatory Visit: Payer: PPO

## 2021-02-23 DIAGNOSIS — E785 Hyperlipidemia, unspecified: Secondary | ICD-10-CM | POA: Diagnosis not present

## 2021-02-23 DIAGNOSIS — I152 Hypertension secondary to endocrine disorders: Secondary | ICD-10-CM | POA: Diagnosis not present

## 2021-02-23 DIAGNOSIS — R42 Dizziness and giddiness: Secondary | ICD-10-CM

## 2021-02-23 DIAGNOSIS — R2689 Other abnormalities of gait and mobility: Secondary | ICD-10-CM

## 2021-02-23 DIAGNOSIS — E1149 Type 2 diabetes mellitus with other diabetic neurological complication: Secondary | ICD-10-CM | POA: Diagnosis not present

## 2021-02-23 DIAGNOSIS — E1159 Type 2 diabetes mellitus with other circulatory complications: Secondary | ICD-10-CM | POA: Diagnosis not present

## 2021-02-23 DIAGNOSIS — R2681 Unsteadiness on feet: Secondary | ICD-10-CM

## 2021-02-23 NOTE — Therapy (Signed)
[place] MAIN [facility] SERVICES [address] [place], [place], [address] Phone: [phone]   Fax:  [phone]  Physical Therapy Treatment  Patient Details  Name: Richard CARTON Sr. MRN: [id number] Date of Birth: 11/08/1951 Referring Provider (PT): Dr. Manuella Ghazi   Encounter Date: 02/23/2021   PT End of Session - 02/23/21 0905    Visit Number 2    Number of Visits 25    Date for PT Re-Evaluation 05/16/21    PT Start Time 0801    PT Stop Time 0846    PT Time Calculation (min) 45 min    Equipment Utilized During Treatment Gait belt    Activity Tolerance Patient tolerated treatment well    Behavior During Therapy [facility] for tasks assessed/performed           Past Medical History:  Diagnosis Date  . Arthritis   . Diabetes mellitus without complication ([place])   . GERD (gastroesophageal reflux disease)   . Hemorrhoid   . Hyperlipidemia   . Hypertension   . Stroke [facility])     Past Surgical History:  Procedure Laterality Date  . ANAL FISTULOTOMY N/A 04/30/2015   Procedure: ANAL FISTULOTOMY;  Surgeon: Christene Lye, MD;  Location: [facility] ORS;  Service: General;  Laterality: N/A;  . COLONOSCOPY N/A 04/30/2015   Procedure: COLONOSCOPY;  Surgeon: Christene Lye, MD;  Location: [facility] ORS;  Service: General;  Laterality: N/A;  . TONSILLECTOMY      There were no vitals filed for this visit.   Subjective Assessment - 02/23/21 0800    Subjective Pt reports very little dizziness currently. Pt denies any pain currently. Pt reports doing HEP.    Pertinent History Pt suffered a right thalamic infarct on 08/30/2017 with left sided weakness numbness. Pt reports he still has some decreased sensation on the left side of his face and left hand. Pt reports that he lost his ability to taste after the stroke, but that this has improved somewhat. Pt states that he has difficulty holding a cup of coffee in his left hand and that he feels like when he is walking  that he sometime notices that he is stepping down hard with left leg as compared to right. Pt reports he has had dizziness for the past 8-10 years, but reports it has gotten worse in the past 2 years. Pt states that he does not get spinning, but that he has been having motion sickness. Pt reports that sometimes when he is driving if he puts on the brakes quickly, he breaks out in a sweat due to dizziness. Pt states that he enjoys fishing, but that he has not been able to go out in the boat because of motion sickness. Pt states that he is all right when the boat is moving, but once the boat stops he gets the sensation of continued movement/rocking and states that it makes him sick to his stomach and he has had episodes of vomiting due to this sensation. Pt states he had mild sea sickness history, but that this has worsened in the past year. Pt states that he can lie down and if he turns over quickly, he gets feeling like he is going to get sick, but denies vertigo. Pt reports that riding in a boat and car that he gets motion sickness upon stopping, rolling over in bed, bending over, looking up and quick movements can all bring on his symptoms. Pt reports that sitting still and resting as well  as Meclizine help to decrease his symptoms. Pt denies vertigo but rather describes his dizziness as unsteadiness, motion sickness and a sensation of aural fullness. Patient's symptoms are motion provoked and intermittent. Pt reports he is getting episodes of dizziness twice a day which last seconds. Pt states he stays still it goes away. Pt reports that he used to have panic and anxiety, but it is better now. Pt reports that he gets headaches about once a week or less. Pt reports no changes in his headaches in the past year. Pt reports he gets a dull pain and denies any history of migraines. Patient has been seen by PA at ENT physician's office on 12/24/20 with negative ENT evaluation per medical record. Patient states that he  Dix-Hallpike testing which was negative. Patient denies having had VNG testing. Per medical record, patient's dizziness is unlikely a peripheral vestibulopathy.    Patient Stated Goals to be able to walk without veering, to be able to go fishing on a boat and to have decreased motion sickness/dizziness    Currently in Pain? No/denies           Treatment -   CGA provided for all treatment today:  Seated VORx1, horizontal head turns, plain background -  4x30 sec; pt reports he feels as if his body is continuing to move.  Seated VORx1, vertical head turns, plain background - 3x30 sec; Pt reports no dizziness with exercise  Standing EC on even surface x30 sec Standing on even surface NBOS EO/EC x30 sec each  Standing on foam WBOS,  EC 4x30 sec; intermittent UE support  Standing on foam NBOS, EC 2x30 sec; intermittent UE support  Standing on foam WBOS and NBOS with horizontal head turns  2x30 sec each, intermittent UE support Standing on foam WBOS and NBOS with vertical head turns 2x30 sec each Semi-tandem walking 4x back and forth in gym Tandem-walking 2x, close CGA Lateral stepping on foam - 6x Cross-over lateral stepping on foam - 6x; intermittent UE support  Assessment: Pt requires intermittent UE support for majority of exercises on foam and with EC on foam. Pt reports feelings of disequilibrium with VOR exercises (greater with horizontal head turns) that resolve within 1-2 minutes. He has tendency to sway/lose postural stability toward L side. Pt also with absent arm swing with semi-tandem and tandem walking, using a slight mid-guard strategy. Pt will benefit from further skilled vestibular therapy to improve functional deficits.      PT Education - 02/23/21 0906    Education Details Continued education on technique with VOR x 1 exercise for both vertical and horizontal head turns    Person(s) Educated Patient    Methods Explanation;Demonstration;Verbal cues    Comprehension  Verbalized understanding;Returned demonstration            PT Short Term Goals - 02/21/21 1008      PT SHORT TERM GOAL #1   Title Pt will be independent with HEP in order to improve balance and decrease dizziness symptoms in order to decrease fall risk and improve function at home and work.    Time 4    Period Weeks    Status New    Target Date 03/21/21             PT Long Term Goals - 02/21/21 1005      PT LONG TERM GOAL #1   Title Patient will demonstrate reduced falls risk as evidenced by Dynamic Gait Index (DGI) 21/24 or greater.  Baseline scored 18/24 on 02/21/21;    Time 12    Period Weeks    Status New    Target Date 05/16/21      PT LONG TERM GOAL #2   Title Patient will have improved FOTO score of 6 points or greater in order to demonstrate improvements in patient's ADLs and functional performance.    Baseline scored 52/100 on 02/21/21    Time 12    Period Weeks    Status New    Target Date 05/16/21      PT LONG TERM GOAL #3   Title Patient will report 50% or greater improvement in his symptoms of dizziness and imbalance with provoking motions or positions in order to be able to go fishing.    Baseline patient reports he has not been able to go fishing on a boat as he gets N & V upon the boat stopping movement;    Time 12    Period Weeks    Status New    Target Date 05/16/21      PT LONG TERM GOAL #4   Title Patient will be able to ambulate with head turns without veering or loss of balance in order for patient to safely ambulate about his home and in the community.    Baseline pt reports unsteadiness and noted mild veering with head turns horizontal and vertical on 02/21/21;    Time 12    Period Weeks    Status New    Target Date 05/16/21            Plan - 02/23/21 0902    Clinical Impression Statement Pt requires intermittent UE support for majority of exercises on foam and with EC on foam. Pt reports feelings of disequilibrium with VOR exercises  (greater with horizontal head turns) that resolve within 1-2 minutes. He has tendency to sway/lose postural stability toward L side. Pt also with absent arm swing with semi-tandem and tandem walking, using a slight mid-guard strategy. Pt will benefit from further skilled vestibular therapy to improve functional deficits.    Personal Factors and Comorbidities Comorbidity 3+;Time since onset of injury/illness/exacerbation    Comorbidities HTN, CVA, DM, hyperlipidemia, generalized anxiety disorder wtih panic attacks    Examination-Activity Limitations [place]    Examination-Participation Restrictions Other   fishing on a boat   Stability/Clinical Decision Making Evolving/Moderate complexity    Rehab Potential Good    PT Frequency 2x / week    PT Duration 12 weeks    PT Treatment/Interventions Canalith Repostioning;Gait training;Stair training;Therapeutic exercise;Therapeutic activities;Balance training;Neuromuscular re-education;Patient/family education;Vestibular;ADLs/Self Care Home Management    PT Next Visit Plan review VOR and progress as able, consider performing head thrust testing and DHI and ABC scale, work on activities with head turns; standing VOR next session    PT Home Exercise Plan VOR X 1 in sitting with plain background 1 minute reps times 3, 3 times a day; HEP via [place] and Agree with Plan of Care Patient           Patient will benefit from skilled therapeutic intervention in order to improve the following deficits and impairments:  Decreased balance,Difficulty walking,Impaired sensation,Dizziness,Decreased strength,Decreased coordination  Visit Diagnosis: Dizziness and giddiness  Unsteadiness on feet  Other abnormalities of gait and mobility     Problem List Patient Active Problem List   Diagnosis Date Noted  . Murmur, cardiac 09/29/2020  . History of CVA (cerebrovascular accident) 12/09/2018  . Anxiety 12/09/2018  .  Type 2 diabetes mellitus with  neurological complications ([place]) 92/44/6286  . Hyperlipidemia 12/06/2018  . Hypertension associated with diabetes ([place]) 12/06/2018  . Right thalamic infarction ([place]) 10/05/2017  . TIA (transient ischemic attack) 08/30/2017   Ricard Dillon PT, DPT 02/23/2021, 9:09 AM  [place] MAIN [facility] SERVICES [address] [place], [place], [address] Phone: [phone]   Fax:  [phone]  Name: Richard COSMAN Sr. MRN: [id number] Date of Birth: 15-Nov-1951

## 2021-02-24 ENCOUNTER — Other Ambulatory Visit: Payer: Self-pay | Admitting: Family Medicine

## 2021-02-24 NOTE — Telephone Encounter (Signed)
Requested medication (s) are due for refill today: yes  Requested medication (s) are on the active medication list: yes  Last refill:  ?  Future visit scheduled: no  Notes to clinic:  please verify dose; pt has 5 mg and 10 mg listed; historical provider    Requested Prescriptions  Pending Prescriptions Disp Refills   amLODipine (NORVASC) 10 MG tablet [Pharmacy Med Name: AMLODIPINE BESYLATE 10 MG TAB] 90 tablet 0    Sig: Take 1 tablet (10 mg total) by mouth daily.      Cardiovascular:  Calcium Channel Blockers Passed - 02/24/2021 10:04 AM      Passed - Last BP in normal range    BP Readings from Last 1 Encounters:  10/26/20 125/66          Passed - Valid encounter within last 6 months    Recent Outpatient Visits           4 months ago Hypertension associated with diabetes [facility])   [facility] Eulogio Bear, NP   4 months ago Type 2 diabetes mellitus with neurological complications [facility])   [place], Jessica A, NP   8 months ago Type 2 diabetes mellitus with neurological complications [facility])   [facility] Volney American, [place]   10 months ago Nausea   [place], [place], [place]   11 months ago Type 2 diabetes mellitus with neurological complications [facility])   [facility] Volney American, [place]       Future Appointments             In 4 weeks [facility], [facility]

## 2021-02-28 ENCOUNTER — Ambulatory Visit: Payer: PPO

## 2021-03-03 ENCOUNTER — Other Ambulatory Visit: Payer: Self-pay

## 2021-03-03 ENCOUNTER — Ambulatory Visit: Payer: PPO

## 2021-03-03 DIAGNOSIS — R2689 Other abnormalities of gait and mobility: Secondary | ICD-10-CM

## 2021-03-03 DIAGNOSIS — R42 Dizziness and giddiness: Secondary | ICD-10-CM | POA: Diagnosis not present

## 2021-03-03 DIAGNOSIS — R2681 Unsteadiness on feet: Secondary | ICD-10-CM

## 2021-03-03 NOTE — Therapy (Deleted)
[place] MAIN [facility] SERVICES [address] [place], [place], [address] Phone: [phone]   Fax:  [phone]  Physical Therapy Treatment  Patient Details  Name: Richard SMICK Sr. MRN: [id number] Date of Birth: 1951/02/25 Referring Provider (PT): Dr. Manuella Ghazi   Encounter Date: 03/03/2021   PT End of Session - 03/03/21 0803    Visit Number 3    Number of Visits 25    Date for PT Re-Evaluation 05/16/21    PT Start Time 0759    PT Stop Time 0845    PT Time Calculation (min) 46 min    Equipment Utilized During Treatment Gait belt    Activity Tolerance Patient tolerated treatment well    Behavior During Therapy [facility] for tasks assessed/performed           Past Medical History:  Diagnosis Date  . Arthritis   . Diabetes mellitus without complication ([place])   . GERD (gastroesophageal reflux disease)   . Hemorrhoid   . Hyperlipidemia   . Hypertension   . Stroke [facility])     Past Surgical History:  Procedure Laterality Date  . ANAL FISTULOTOMY N/A 04/30/2015   Procedure: ANAL FISTULOTOMY;  Surgeon: Christene Lye, MD;  Location: [facility] ORS;  Service: General;  Laterality: N/A;  . COLONOSCOPY N/A 04/30/2015   Procedure: COLONOSCOPY;  Surgeon: Christene Lye, MD;  Location: [facility] ORS;  Service: General;  Laterality: N/A;  . TONSILLECTOMY      There were no vitals filed for this visit.   TREATMENT  Neuro Re-ed:  Head Thrust Testing: Positive B demonstrating undershooting with corrective saccades  DHI: 34 (mild handicap) --New goal: Patient will reduce dizziness handicap inventory score to <16, for less dizziness with ADLs and increased safety with home and work tasks.   ABC scale: 66.25% --New goal: Patient will increase ABC scale score >80% to demonstrate better functional mobility and better confidence with ADLs.   Orthostatics: Supine: 168/72, HR 61 Seated: 163/78, HR 63 some sx Standing: 154/74 HR 63  CGA provided for  all the following interventions:  Seated VORx1, horizontal head turns, plain background -  2x30 sec; pt reports he feels as if his body is continuing to move.   Standing VORx1, horizontal head turns, plain background -  3x30 sec; pt reports he feels as if his body is continuing to move; pt states he feels like he is "swaying"   Standing VORx2, with horizontal head turns 1x30 sec, plain background; Pt with multiple instances of corrective saccades, pt reports he feels "unbalanced," no decreases in postural stability noted.   Supine VORx1 with horizontal head turns 1x45 sec Pt reports increased sx with going from standing>supine on mat table, he states brief spinning until he "got my eyes focused and it stopped."  Additional round of supine VORx2 with horizontal head turns 1x45 sec  Walking with diagonal head turns, L to R and R to L - 6x, CGA-min assist x1, significant decrease in gait speed, mild staggering, one instance of pt using stepping strategy to maintain balance.  Assessment: Pt further tested today and found to have a positive Head Thrust Test B demonstrating undershooting followed by corrective saccades. Pt score on DHI was 34 indicating mild handicap, and score on ABC Scale was 66.25, indicating decrease in pt confidence in balance with activities. PT progressed and educated pt on VORx2 today with plain background, where pt had difficulty maintaining focus on target. Pt will continue to benefit from further  skilled therapy to improve the prior mentioned functional deficits in order to increase balance and safety with all activities.       PT Short Term Goals - 03/03/21 1712      PT SHORT TERM GOAL #1   Title Pt will be independent with HEP in order to improve balance and decrease dizziness symptoms in order to decrease fall risk and improve function at home and work.    Time 4    Period Weeks    Status New    Target Date 03/21/21             PT Long Term Goals - 03/03/21  1711      PT LONG TERM GOAL #1   Title Patient will demonstrate reduced falls risk as evidenced by Dynamic Gait Index (DGI) 21/24 or greater.    Baseline scored 18/24 on 02/21/21;    Time 12    Period Weeks    Status New    Target Date 05/16/21      PT LONG TERM GOAL #2   Title Patient will have improved FOTO score of 6 points or greater in order to demonstrate improvements in patient's ADLs and functional performance.    Baseline scored 52/100 on 02/21/21    Time 12    Period Weeks    Status New    Target Date 05/16/21      PT LONG TERM GOAL #3   Title Patient will report 50% or greater improvement in his symptoms of dizziness and imbalance with provoking motions or positions in order to be able to go fishing.    Baseline patient reports he has not been able to go fishing on a boat as he gets N & V upon the boat stopping movement;    Time 12    Period Weeks    Status New    Target Date 05/16/21      PT LONG TERM GOAL #4   Title Patient will be able to ambulate with head turns without veering or loss of balance in order for patient to safely ambulate about his home and in the community.    Baseline pt reports unsteadiness and noted mild veering with head turns horizontal and vertical on 02/21/21;    Time 12    Period Weeks    Status New    Target Date 05/16/21      PT LONG TERM GOAL #5   Title Patient will reduce dizziness handicap inventory score to <16, for less dizziness with ADLs and increased safety with home and work tasks.    Baseline 03/03/2021: score 34 (mild handicap)    Time 10    Period Weeks    Status New    Target Date 05/12/21      Additional Long Term Goals   Additional Long Term Goals Yes      PT LONG TERM GOAL #6   Title Patient will increase ABC scale score >80% to demonstrate better functional mobility and better confidence with ADLs.    Baseline 03/03/2021: score 66.25%    Time 10    Period Weeks    Status New    Target Date 05/12/21             Patient will benefit from skilled therapeutic intervention in order to improve the following deficits and impairments:  Decreased balance,Difficulty walking,Impaired sensation,Dizziness,Decreased strength,Decreased coordination  Visit Diagnosis: Dizziness and giddiness  Unsteadiness on feet  Other abnormalities of gait and mobility  Problem List Patient Active Problem List   Diagnosis Date Noted  . Murmur, cardiac 09/29/2020  . History of CVA (cerebrovascular accident) 12/09/2018  . Anxiety 12/09/2018  . Type 2 diabetes mellitus with neurological complications ([place]) 97/94/9971  . Hyperlipidemia 12/06/2018  . Hypertension associated with diabetes ([place]) 12/06/2018  . Right thalamic infarction ([place]) 10/05/2017  . TIA (transient ischemic attack) 08/30/2017   Ricard Dillon PT, DPT 03/03/2021, 5:21 PM  [place] MAIN [facility] SERVICES [address] [place], [place], [address] Phone: [phone]   Fax:  [phone]  Name: Richard AMINI Sr. MRN: [id number] Date of Birth: 08/31/1951

## 2021-03-03 NOTE — Therapy (Signed)
[place] MAIN [facility] SERVICES [address] [place], [place], [address] Phone: [phone]   Fax:  [phone]  Physical Therapy Treatment  Patient Details  Name: Richard UMHOLTZ Sr. MRN: [id number] Date of Birth: Dec 24, 1950 Referring Provider (PT): Dr. Manuella Ghazi   Encounter Date: 03/03/2021   PT End of Session - 03/03/21 0803    Visit Number 3    Number of Visits 25    Date for PT Re-Evaluation 05/16/21    PT Start Time 0759    PT Stop Time 0845    PT Time Calculation (min) 46 min    Equipment Utilized During Treatment Gait belt    Activity Tolerance Patient tolerated treatment well    Behavior During Therapy [facility] for tasks assessed/performed           Past Medical History:  Diagnosis Date  . Arthritis   . Diabetes mellitus without complication ([place])   . GERD (gastroesophageal reflux disease)   . Hemorrhoid   . Hyperlipidemia   . Hypertension   . Stroke [facility])     Past Surgical History:  Procedure Laterality Date  . ANAL FISTULOTOMY N/A 04/30/2015   Procedure: ANAL FISTULOTOMY;  Surgeon: Christene Lye, MD;  Location: [facility] ORS;  Service: General;  Laterality: N/A;  . COLONOSCOPY N/A 04/30/2015   Procedure: COLONOSCOPY;  Surgeon: Christene Lye, MD;  Location: [facility] ORS;  Service: General;  Laterality: N/A;  . TONSILLECTOMY      There were no vitals filed for this visit.   Subjective Assessment - 03/03/21 0804    Subjective Pt reports only slight dizziness in past day with cervical lateral flexion.    Pertinent History Pt suffered a right thalamic infarct on 08/30/2017 with left sided weakness numbness. Pt reports he still has some decreased sensation on the left side of his face and left hand. Pt reports that he lost his ability to taste after the stroke, but that this has improved somewhat. Pt states that he has difficulty holding a cup of coffee in his left hand and that he feels like when he is walking that he sometime  notices that he is stepping down hard with left leg as compared to right. Pt reports he has had dizziness for the past 8-10 years, but reports it has gotten worse in the past 2 years. Pt states that he does not get spinning, but that he has been having motion sickness. Pt reports that sometimes when he is driving if he puts on the brakes quickly, he breaks out in a sweat due to dizziness. Pt states that he enjoys fishing, but that he has not been able to go out in the boat because of motion sickness. Pt states that he is all right when the boat is moving, but once the boat stops he gets the sensation of continued movement/rocking and states that it makes him sick to his stomach and he has had episodes of vomiting due to this sensation. Pt states he had mild sea sickness history, but that this has worsened in the past year. Pt states that he can lie down and if he turns over quickly, he gets feeling like he is going to get sick, but denies vertigo. Pt reports that riding in a boat and car that he gets motion sickness upon stopping, rolling over in bed, bending over, looking up and quick movements can all bring on his symptoms. Pt reports that sitting still and resting as well as Meclizine help  to decrease his symptoms. Pt denies vertigo but rather describes his dizziness as unsteadiness, motion sickness and a sensation of aural fullness. Patient's symptoms are motion provoked and intermittent. Pt reports he is getting episodes of dizziness twice a day which last seconds. Pt states he stays still it goes away. Pt reports that he used to have panic and anxiety, but it is better now. Pt reports that he gets headaches about once a week or less. Pt reports no changes in his headaches in the past year. Pt reports he gets a dull pain and denies any history of migraines. Patient has been seen by PA at ENT physician's office on 12/24/20 with negative ENT evaluation per medical record. Patient states that he Dix-Hallpike testing  which was negative. Patient denies having had VNG testing. Per medical record, patient's dizziness is unlikely a peripheral vestibulopathy.    Patient Stated Goals to be able to walk without veering, to be able to go fishing on a boat and to have decreased motion sickness/dizziness    Currently in Pain? No/denies            TREATMENT  Neuro Re-ed:  Head Thrust Testing: Positive B demonstrating undershooting with corrective saccades  DHI: 34 (mild handicap) --New goal: Patient will reduce dizziness handicap inventory score to <16, for less dizziness with ADLs and increased safety with home and work tasks.   ABC scale: 66.25% --New goal: Patient will increase ABC scale score >80% to demonstrate better functional mobility and better confidence with ADLs.   Orthostatics: Supine: 168/72, HR 61 Seated: 163/78, HR 63 some sx Standing: 154/74 HR 63  CGA provided for all the following interventions:  Seated VORx1, horizontal head turns, plain background -  2x30 sec; pt reports he feels as if his body is continuing to move.   Standing VORx1, horizontal head turns, plain background -  3x30 sec; pt reports he feels as if his body is continuing to move; pt states he feels like he is "swaying"   Standing VORx2, with horizontal head turns 1x30 sec, plain background; Pt with multiple instances of corrective saccades, pt reports he feels "unbalanced," no decreases in postural stability noted.   Supine VORx1 with horizontal head turns 1x45 sec Pt reports increased sx with going from standing>supine on mat table, he states brief spinning until he "got my eyes focused and it stopped."  Additional round of supine VORx2 with horizontal head turns 1x45 sec  Walking with diagonal head turns, L to R and R to L - 6x, CGA-min assist x1, significant decrease in gait speed, mild staggering, one instance of pt using stepping strategy to maintain balance.  Assessment: Pt further tested today and found to  have a positive Head Thrust Test B demonstrating undershooting followed by corrective saccades. Pt score on DHI was 34 indicating mild handicap, and score on ABC Scale was 66.25, indicating decrease in pt confidence in balance with activities. PT progressed and educated pt on VORx2 today with plain background, where pt had difficulty maintaining focus on target. Pt will continue to benefit from further skilled therapy to improve the prior mentioned functional deficits in order to increase balance and safety with all activities.          PT Education - 03/03/21 1702    Education Details Pt educated on technique with VOR x 2    Person(s) Educated Patient    Methods Explanation;Demonstration;Tactile cues;Verbal cues    Comprehension Verbalized understanding;Returned demonstration  PT Short Term Goals - 03/03/21 1712      PT SHORT TERM GOAL #1   Title Pt will be independent with HEP in order to improve balance and decrease dizziness symptoms in order to decrease fall risk and improve function at home and work.    Time 4    Period Weeks    Status New    Target Date 03/21/21             PT Long Term Goals - 03/03/21 1711      PT LONG TERM GOAL #1   Title Patient will demonstrate reduced falls risk as evidenced by Dynamic Gait Index (DGI) 21/24 or greater.    Baseline scored 18/24 on 02/21/21;    Time 12    Period Weeks    Status New    Target Date 05/16/21      PT LONG TERM GOAL #2   Title Patient will have improved FOTO score of 6 points or greater in order to demonstrate improvements in patient's ADLs and functional performance.    Baseline scored 52/100 on 02/21/21    Time 12    Period Weeks    Status New    Target Date 05/16/21      PT LONG TERM GOAL #3   Title Patient will report 50% or greater improvement in his symptoms of dizziness and imbalance with provoking motions or positions in order to be able to go fishing.    Baseline patient reports he has not been  able to go fishing on a boat as he gets N & V upon the boat stopping movement;    Time 12    Period Weeks    Status New    Target Date 05/16/21      PT LONG TERM GOAL #4   Title Patient will be able to ambulate with head turns without veering or loss of balance in order for patient to safely ambulate about his home and in the community.    Baseline pt reports unsteadiness and noted mild veering with head turns horizontal and vertical on 02/21/21;    Time 12    Period Weeks    Status New    Target Date 05/16/21      PT LONG TERM GOAL #5   Title Patient will reduce dizziness handicap inventory score to <16, for less dizziness with ADLs and increased safety with home and work tasks.    Baseline 03/03/2021: score 34 (mild handicap)    Time 10    Period Weeks    Status New    Target Date 05/12/21      Additional Long Term Goals   Additional Long Term Goals Yes      PT LONG TERM GOAL #6   Title Patient will increase ABC scale score >80% to demonstrate better functional mobility and better confidence with ADLs.    Baseline 03/03/2021: score 66.25%    Time 10    Period Weeks    Status New    Target Date 05/12/21                 Plan - 03/03/21 1719    Clinical Impression Statement Pt further tested today and found to have a positive Head Thrust Test B demonstrating undershooting followed by corrective saccades. Pt score on DHI was 34 indicating mild handicap, and score on ABC Scale was 66.25, indicating decrease in pt confidence in balance with activities. PT progressed and educated pt on VORx2 today  with plain background, where pt had difficulty maintaining focus on target. Pt will continue to benefit from further skilled therapy to improve the prior mentioned functional deficits in order to increase balance and safety with all activities.    Personal Factors and Comorbidities Comorbidity 3+;Time since onset of injury/illness/exacerbation    Comorbidities HTN, CVA, DM,  hyperlipidemia, generalized anxiety disorder wtih panic attacks    Examination-Activity Limitations [place]    Examination-Participation Restrictions Other   fishing on a boat   Stability/Clinical Decision Making Evolving/Moderate complexity    Rehab Potential Good    PT Frequency 2x / week    PT Duration 12 weeks    PT Treatment/Interventions Canalith Repostioning;Gait training;Stair training;Therapeutic exercise;Therapeutic activities;Balance training;Neuromuscular re-education;Patient/family education;Vestibular;ADLs/Self Care Home Management    PT Next Visit Plan review VOR and progress as able, consider performing head thrust testing and DHI and ABC scale, work on activities with head turns; standing VOR next session; VOR x2    PT Home Exercise Plan VOR X 1 in sitting with plain background 1 minute reps times 3, 3 times a day; HEP via [facility] and Agree with Plan of Care Patient           Patient will benefit from skilled therapeutic intervention in order to improve the following deficits and impairments:  Decreased balance,Difficulty walking,Impaired sensation,Dizziness,Decreased strength,Decreased coordination  Visit Diagnosis: Dizziness and giddiness  Unsteadiness on feet  Other abnormalities of gait and mobility     Problem List Patient Active Problem List   Diagnosis Date Noted  . Murmur, cardiac 09/29/2020  . History of CVA (cerebrovascular accident) 12/09/2018  . Anxiety 12/09/2018  . Type 2 diabetes mellitus with neurological complications ([place]) 17/49/4496  . Hyperlipidemia 12/06/2018  . Hypertension associated with diabetes ([place]) 12/06/2018  . Right thalamic infarction ([place]) 10/05/2017  . TIA (transient ischemic attack) 08/30/2017   Ricard Dillon PT, DPT 03/03/2021, 5:22 PM  [place] MAIN [facility] SERVICES [address] [place], [place], [address] Phone: [phone]   Fax:  [phone]  Name: ADEYEMI HAMAD  Sr. MRN: [id number] Date of Birth: [birth year]

## 2021-03-08 ENCOUNTER — Ambulatory Visit: Payer: PPO

## 2021-03-08 ENCOUNTER — Other Ambulatory Visit: Payer: Self-pay

## 2021-03-08 DIAGNOSIS — R42 Dizziness and giddiness: Secondary | ICD-10-CM

## 2021-03-08 DIAGNOSIS — R2681 Unsteadiness on feet: Secondary | ICD-10-CM

## 2021-03-08 DIAGNOSIS — R2689 Other abnormalities of gait and mobility: Secondary | ICD-10-CM

## 2021-03-08 NOTE — Therapy (Signed)
[place] MAIN [facility] SERVICES [address] [place], [place], [address] Phone: [phone]   Fax:  [phone]  Physical Therapy Treatment  Patient Details  Name: Richard Thornton Sr. MRN: [id number] Date of Birth: Jul 09, 1951 Referring Provider (PT): Dr. Manuella Ghazi   Encounter Date: 03/08/2021   PT End of Session - 03/08/21 0847    Visit Number 4    Number of Visits 25    Date for PT Re-Evaluation 05/16/21    PT Start Time 0758    PT Stop Time 0842    PT Time Calculation (min) 44 min    Equipment Utilized During Treatment Gait belt    Activity Tolerance Patient tolerated treatment well    Behavior During Therapy [facility] for tasks assessed/performed           Past Medical History:  Diagnosis Date  . Arthritis   . Diabetes mellitus without complication ([place])   . GERD (gastroesophageal reflux disease)   . Hemorrhoid   . Hyperlipidemia   . Hypertension   . Stroke [facility])     Past Surgical History:  Procedure Laterality Date  . ANAL FISTULOTOMY N/A 04/30/2015   Procedure: ANAL FISTULOTOMY;  Surgeon: Christene Lye, MD;  Location: [facility] ORS;  Service: General;  Laterality: N/A;  . COLONOSCOPY N/A 04/30/2015   Procedure: COLONOSCOPY;  Surgeon: Christene Lye, MD;  Location: [facility] ORS;  Service: General;  Laterality: N/A;  . TONSILLECTOMY      There were no vitals filed for this visit.   Subjective Assessment - 03/08/21 0758    Subjective Pt reports he did fine after last session. Pt says his allergies are "pretty bad."  Pt says he did get "under a car and it wasn't real bad like it was."  Pt reports doing HEP.    Pertinent History Pt suffered a right thalamic infarct on 08/30/2017 with left sided weakness numbness. Pt reports he still has some decreased sensation on the left side of his face and left hand. Pt reports that he lost his ability to taste after the stroke, but that this has improved somewhat. Pt states that he has difficulty  holding a cup of coffee in his left hand and that he feels like when he is walking that he sometime notices that he is stepping down hard with left leg as compared to right. Pt reports he has had dizziness for the past 8-10 years, but reports it has gotten worse in the past 2 years. Pt states that he does not get spinning, but that he has been having motion sickness. Pt reports that sometimes when he is driving if he puts on the brakes quickly, he breaks out in a sweat due to dizziness. Pt states that he enjoys fishing, but that he has not been able to go out in the boat because of motion sickness. Pt states that he is all right when the boat is moving, but once the boat stops he gets the sensation of continued movement/rocking and states that it makes him sick to his stomach and he has had episodes of vomiting due to this sensation. Pt states he had mild sea sickness history, but that this has worsened in the past year. Pt states that he can lie down and if he turns over quickly, he gets feeling like he is going to get sick, but denies vertigo. Pt reports that riding in a boat and car that he gets motion sickness upon stopping, rolling over in bed,  bending over, looking up and quick movements can all bring on his symptoms. Pt reports that sitting still and resting as well as Meclizine help to decrease his symptoms. Pt denies vertigo but rather describes his dizziness as unsteadiness, motion sickness and a sensation of aural fullness. Patient's symptoms are motion provoked and intermittent. Pt reports he is getting episodes of dizziness twice a day which last seconds. Pt states he stays still it goes away. Pt reports that he used to have panic and anxiety, but it is better now. Pt reports that he gets headaches about once a week or less. Pt reports no changes in his headaches in the past year. Pt reports he gets a dull pain and denies any history of migraines. Patient has been seen by PA at ENT physician's office on  12/24/20 with negative ENT evaluation per medical record. Patient states that he Dix-Hallpike testing which was negative. Patient denies having had VNG testing. Per medical record, patient's dizziness is unlikely a peripheral vestibulopathy.    Patient Stated Goals to be able to walk without veering, to be able to go fishing on a boat and to have decreased motion sickness/dizziness    Currently in Pain? No/denies         TREATMENT  Neuro Re-ed:  CGA provided for all the following interventions:  Seated VORx1, horizontal head turns, plain background -  x30 sec;   Seated VORx2, horizontal head turns, plain background -  2x30 sec; pt reports no sx   Upper trap stretches 2x30 sec B to address soreness  Cervical rotation stretches 2x30 sec to address limitations reported with cervical rotation when driving; pt reports L side feels "tighter," pt reports some dizziness with stretch  Standing VORx2, with horizontal head turns, CGA, 2x30 sec, plain background; Pt with multiple instances of corrective saccades, pt reports he feels "unbalanced," no decreases in postural stability noted.   STS on airex pad with eyes focused on target 5x; no sx with exercises. CGA provided.  STS on airex pad 2 target VOR 5x; only one instance of dizziness turning head to the L. CGA provided.  Standing VORx2, with vertical head turns 2x30 sec, plain background; No sx reported with exercise. CGA provided  Walking with vertical and horizontal head turns 6x back and forth for each. Pt with greater difficulty with horizontal head turns. Pt reports feels "easier" than last session. CGA provided.  Seated CC and CW with red ball x several times each; pt reports feeling off balance/motion sick when in the bending forward/looking down portion.  Pt reports this exercise has provoked his sx more than any other today.  Assessment: Pt with self-reported improvement with walking with horizontal/vertical head turns and VOR  exercises compared to previous session. Pt had greatest increase in sx and reported motion sickness and visual sensitivity with seated CC/CW rotations with red ball, where bending forward and focusing on target was most difficult. Pt will benefit from further skilled therapy to improve the aforementioned deficits and balance.    PT Short Term Goals - 03/03/21 1712      PT SHORT TERM GOAL #1   Title Pt will be independent with HEP in order to improve balance and decrease dizziness symptoms in order to decrease fall risk and improve function at home and work.    Time 4    Period Weeks    Status New    Target Date 03/21/21             PT  Long Term Goals - 03/03/21 1711      PT LONG TERM GOAL #1   Title Patient will demonstrate reduced falls risk as evidenced by Dynamic Gait Index (DGI) 21/24 or greater.    Baseline scored 18/24 on 02/21/21;    Time 12    Period Weeks    Status New    Target Date 05/16/21      PT LONG TERM GOAL #2   Title Patient will have improved FOTO score of 6 points or greater in order to demonstrate improvements in patient's ADLs and functional performance.    Baseline scored 52/100 on 02/21/21    Time 12    Period Weeks    Status New    Target Date 05/16/21      PT LONG TERM GOAL #3   Title Patient will report 50% or greater improvement in his symptoms of dizziness and imbalance with provoking motions or positions in order to be able to go fishing.    Baseline patient reports he has not been able to go fishing on a boat as he gets N & V upon the boat stopping movement;    Time 12    Period Weeks    Status New    Target Date 05/16/21      PT LONG TERM GOAL #4   Title Patient will be able to ambulate with head turns without veering or loss of balance in order for patient to safely ambulate about his home and in the community.    Baseline pt reports unsteadiness and noted mild veering with head turns horizontal and vertical on 02/21/21;    Time 12    Period  Weeks    Status New    Target Date 05/16/21      PT LONG TERM GOAL #5   Title Patient will reduce dizziness handicap inventory score to <16, for less dizziness with ADLs and increased safety with home and work tasks.    Baseline 03/03/2021: score 34 (mild handicap)    Time 10    Period Weeks    Status New    Target Date 05/12/21      Additional Long Term Goals   Additional Long Term Goals Yes      PT LONG TERM GOAL #6   Title Patient will increase ABC scale score >80% to demonstrate better functional mobility and better confidence with ADLs.    Baseline 03/03/2021: score 66.25%    Time 10    Period Weeks    Status New    Target Date 05/12/21                 Plan - 03/08/21 0848    Clinical Impression Statement Pt with self-reported improvement with walking with horizontal/vertical head turns and VOR exercises compared to previous session. Pt had greatest increase in sx and reported motion sickness and visual sensitivity with seated CC/CW rotations with red ball, where bending forward and focusing on target was most difficult. Pt will benefit from further skilled therapy to improve the aforementioned deficits and balance.    Personal Factors and Comorbidities Comorbidity 3+;Time since onset of injury/illness/exacerbation    Comorbidities HTN, CVA, DM, hyperlipidemia, generalized anxiety disorder wtih panic attacks    Examination-Activity Limitations [place]    Examination-Participation Restrictions Other   fishing on a boat   Stability/Clinical Decision Making Evolving/Moderate complexity    Rehab Potential Good    PT Frequency 2x / week    PT Duration 12 weeks  PT Treatment/Interventions Canalith Repostioning;Gait training;Stair training;Therapeutic exercise;Therapeutic activities;Balance training;Neuromuscular re-education;Patient/family education;Vestibular;ADLs/Self Care Home Management    PT Next Visit Plan review VOR and progress as able, consider performing head thrust  testing and DHI and ABC scale, work on activities with head turns; standing VOR next session; VOR x2; two target VOR    PT Home Exercise Plan VOR X 1 in sitting with plain background 1 minute reps times 3, 3 times a day; HEP via [place] and Agree with Plan of Care Patient           Patient will benefit from skilled therapeutic intervention in order to improve the following deficits and impairments:  Decreased balance,Difficulty walking,Impaired sensation,Dizziness,Decreased strength,Decreased coordination  Visit Diagnosis: Dizziness and giddiness  Unsteadiness on feet  Other abnormalities of gait and mobility     Problem List Patient Active Problem List   Diagnosis Date Noted  . Murmur, cardiac 09/29/2020  . History of CVA (cerebrovascular accident) 12/09/2018  . Anxiety 12/09/2018  . Type 2 diabetes mellitus with neurological complications ([place]) 28/41/3244  . Hyperlipidemia 12/06/2018  . Hypertension associated with diabetes ([place]) 12/06/2018  . Right thalamic infarction ([place]) 10/05/2017  . TIA (transient ischemic attack) 08/30/2017   Ricard Dillon PT, DPT 03/08/2021, 11:58 AM  [place] [address] [place], [place], [address] Phone: [phone]   Fax:  [phone]  Name: REASON HELZER Sr. MRN: [id number] Date of Birth: [birth year]

## 2021-03-09 DIAGNOSIS — D369 Benign neoplasm, unspecified site: Secondary | ICD-10-CM | POA: Diagnosis not present

## 2021-03-09 DIAGNOSIS — I639 Cerebral infarction, unspecified: Secondary | ICD-10-CM | POA: Diagnosis not present

## 2021-03-09 DIAGNOSIS — E785 Hyperlipidemia, unspecified: Secondary | ICD-10-CM | POA: Diagnosis not present

## 2021-03-09 DIAGNOSIS — I152 Hypertension secondary to endocrine disorders: Secondary | ICD-10-CM | POA: Diagnosis not present

## 2021-03-09 DIAGNOSIS — E1149 Type 2 diabetes mellitus with other diabetic neurological complication: Secondary | ICD-10-CM | POA: Diagnosis not present

## 2021-03-09 DIAGNOSIS — Z125 Encounter for screening for malignant neoplasm of prostate: Secondary | ICD-10-CM | POA: Diagnosis not present

## 2021-03-09 DIAGNOSIS — Z1159 Encounter for screening for other viral diseases: Secondary | ICD-10-CM | POA: Diagnosis not present

## 2021-03-09 DIAGNOSIS — E1159 Type 2 diabetes mellitus with other circulatory complications: Secondary | ICD-10-CM | POA: Diagnosis not present

## 2021-03-09 DIAGNOSIS — Z8673 Personal history of transient ischemic attack (TIA), and cerebral infarction without residual deficits: Secondary | ICD-10-CM | POA: Diagnosis not present

## 2021-03-09 DIAGNOSIS — Z0000 Encounter for general adult medical examination without abnormal findings: Secondary | ICD-10-CM | POA: Diagnosis not present

## 2021-03-10 ENCOUNTER — Ambulatory Visit: Payer: PPO

## 2021-03-15 ENCOUNTER — Ambulatory Visit: Payer: PPO

## 2021-03-17 ENCOUNTER — Ambulatory Visit: Payer: PPO

## 2021-03-22 ENCOUNTER — Ambulatory Visit: Payer: PPO | Attending: Neurology

## 2021-03-22 DIAGNOSIS — R2689 Other abnormalities of gait and mobility: Secondary | ICD-10-CM | POA: Insufficient documentation

## 2021-03-22 DIAGNOSIS — R262 Difficulty in walking, not elsewhere classified: Secondary | ICD-10-CM | POA: Insufficient documentation

## 2021-03-22 DIAGNOSIS — R2681 Unsteadiness on feet: Secondary | ICD-10-CM | POA: Insufficient documentation

## 2021-03-22 DIAGNOSIS — R42 Dizziness and giddiness: Secondary | ICD-10-CM | POA: Insufficient documentation

## 2021-03-24 ENCOUNTER — Ambulatory Visit: Payer: PPO

## 2021-03-29 ENCOUNTER — Other Ambulatory Visit: Payer: Self-pay

## 2021-03-29 ENCOUNTER — Ambulatory Visit: Payer: PPO

## 2021-03-29 DIAGNOSIS — R262 Difficulty in walking, not elsewhere classified: Secondary | ICD-10-CM | POA: Diagnosis not present

## 2021-03-29 DIAGNOSIS — R42 Dizziness and giddiness: Secondary | ICD-10-CM | POA: Diagnosis not present

## 2021-03-29 DIAGNOSIS — R2689 Other abnormalities of gait and mobility: Secondary | ICD-10-CM | POA: Diagnosis not present

## 2021-03-29 DIAGNOSIS — R2681 Unsteadiness on feet: Secondary | ICD-10-CM

## 2021-03-29 NOTE — Therapy (Signed)
[place] MAIN [facility] SERVICES [address] [place], [place], [address] Phone: [phone]   Fax:  [phone]  Physical Therapy Treatment  Patient Details  Name: Richard RIVIELLO Sr. MRN: [id number] Date of Birth: 23-Sep-1951 Referring Provider (PT): Dr. Manuella Ghazi   Encounter Date: 03/29/2021   PT End of Session - 03/29/21 1229    Visit Number 5    Number of Visits 25    Date for PT Re-Evaluation 05/16/21    PT Start Time 0800    PT Stop Time 0843    PT Time Calculation (min) 43 min    Equipment Utilized During Treatment Gait belt    Activity Tolerance Patient tolerated treatment well    Behavior During Therapy WFL for tasks assessed/performed           Past Medical History:  Diagnosis Date  . Arthritis   . Diabetes mellitus without complication ([place])   . GERD (gastroesophageal reflux disease)   . Hemorrhoid   . Hyperlipidemia   . Hypertension   . Stroke [facility])     Past Surgical History:  Procedure Laterality Date  . ANAL FISTULOTOMY N/A 04/30/2015   Procedure: ANAL FISTULOTOMY;  Surgeon: Christene Lye, MD;  Location: [facility] ORS;  Service: General;  Laterality: N/A;  . COLONOSCOPY N/A 04/30/2015   Procedure: COLONOSCOPY;  Surgeon: Christene Lye, MD;  Location: [facility] ORS;  Service: General;  Laterality: N/A;  . TONSILLECTOMY      There were no vitals filed for this visit.   Subjective Assessment - 03/29/21 0801    Subjective Pt denies pain. Pt reports slight improvement with motion sickness sx while working under a car. Still having some motion sickness that takes 5-10 minutes to resolve. Pt reports doing some HEP.    Pertinent History Pt suffered a right thalamic infarct on 08/30/2017 with left sided weakness numbness. Pt reports he still has some decreased sensation on the left side of his face and left hand. Pt reports that he lost his ability to taste after the stroke, but that this has improved somewhat. Pt states that he  has difficulty holding a cup of coffee in his left hand and that he feels like when he is walking that he sometime notices that he is stepping down hard with left leg as compared to right. Pt reports he has had dizziness for the past 8-10 years, but reports it has gotten worse in the past 2 years. Pt states that he does not get spinning, but that he has been having motion sickness. Pt reports that sometimes when he is driving if he puts on the brakes quickly, he breaks out in a sweat due to dizziness. Pt states that he enjoys fishing, but that he has not been able to go out in the boat because of motion sickness. Pt states that he is all right when the boat is moving, but once the boat stops he gets the sensation of continued movement/rocking and states that it makes him sick to his stomach and he has had episodes of vomiting due to this sensation. Pt states he had mild sea sickness history, but that this has worsened in the past year. Pt states that he can lie down and if he turns over quickly, he gets feeling like he is going to get sick, but denies vertigo. Pt reports that riding in a boat and car that he gets motion sickness upon stopping, rolling over in bed, bending over, looking up and  quick movements can all bring on his symptoms. Pt reports that sitting still and resting as well as Meclizine help to decrease his symptoms. Pt denies vertigo but rather describes his dizziness as unsteadiness, motion sickness and a sensation of aural fullness. Patient's symptoms are motion provoked and intermittent. Pt reports he is getting episodes of dizziness twice a day which last seconds. Pt states he stays still it goes away. Pt reports that he used to have panic and anxiety, but it is better now. Pt reports that he gets headaches about once a week or less. Pt reports no changes in his headaches in the past year. Pt reports he gets a dull pain and denies any history of migraines. Patient has been seen by PA at ENT  physician's office on 12/24/20 with negative ENT evaluation per medical record. Patient states that he Dix-Hallpike testing which was negative. Patient denies having had VNG testing. Per medical record, patient's dizziness is unlikely a peripheral vestibulopathy.    Patient Stated Goals to be able to walk without veering, to be able to go fishing on a boat and to have decreased motion sickness/dizziness    Currently in Pain? No/denies          TREATMENT   Neuro Re-ed:   CGA provided for all the following interventions:   Seated VORx1, horizontal head turns, plain background -  x30 sec; no sx reported   Seated VORx2, horizontal head turns, plain background -  2x30 sec; pt reports no sx    Upper trap stretches 2x30 sec B, VC/Demo for technique  Cervical rotation stretches 2x30 sec to address limitations reported with cervical rotation when driving; VC/Demo for technique   Standing VORx1, with horizontal head turns, busy background CGA, 2x30 sec; Pt with brief sx that resolved quickly  Standing VORx1 with NBOS with horizontal head turns, busy background, CGA 30 sec; pt reports no sx  Supine VORx1 with horizontal head turns, plain background, 3x30 sec Requires more rest compared to seated/standing exercises for sx resolution  Bending to pick up and stack cones x 5 min; pt with brief sx  Amulation with horizontal head turns while looking at [facility] ball; pt reports brief sx, observed gait variability 6x, CGA   PT Education - 03/29/21 1229    Education Details Pt educated on frequency and duration, technique of supine vorx1 exercise    Person(s) Educated Patient    Methods Explanation;Verbal cues    Comprehension Verbalized understanding;Returned demonstration            PT Short Term Goals - 03/03/21 1712      PT SHORT TERM GOAL #1   Title Pt will be independent with HEP in order to improve balance and decrease dizziness symptoms in order to decrease fall risk and improve  function at home and work.    Time 4    Period Weeks    Status New    Target Date 03/21/21             PT Long Term Goals - 03/03/21 1711      PT LONG TERM GOAL #1   Title Patient will demonstrate reduced falls risk as evidenced by Dynamic Gait Index (DGI) 21/24 or greater.    Baseline scored 18/24 on 02/21/21;    Time 12    Period Weeks    Status New    Target Date 05/16/21      PT LONG TERM GOAL #2   Title Patient will have improved FOTO  score of 6 points or greater in order to demonstrate improvements in patient's ADLs and functional performance.    Baseline scored 52/100 on 02/21/21    Time 12    Period Weeks    Status New    Target Date 05/16/21      PT LONG TERM GOAL #3   Title Patient will report 50% or greater improvement in his symptoms of dizziness and imbalance with provoking motions or positions in order to be able to go fishing.    Baseline patient reports he has not been able to go fishing on a boat as he gets N & V upon the boat stopping movement;    Time 12    Period Weeks    Status New    Target Date 05/16/21      PT LONG TERM GOAL #4   Title Patient will be able to ambulate with head turns without veering or loss of balance in order for patient to safely ambulate about his home and in the community.    Baseline pt reports unsteadiness and noted mild veering with head turns horizontal and vertical on 02/21/21;    Time 12    Period Weeks    Status New    Target Date 05/16/21      PT LONG TERM GOAL #5   Title Patient will reduce dizziness handicap inventory score to <16, for less dizziness with ADLs and increased safety with home and work tasks.    Baseline 03/03/2021: score 34 (mild handicap)    Time 10    Period Weeks    Status New    Target Date 05/12/21      Additional Long Term Goals   Additional Long Term Goals Yes      PT LONG TERM GOAL #6   Title Patient will increase ABC scale score >80% to demonstrate better functional mobility and better  confidence with ADLs.    Baseline 03/03/2021: score 66.25%    Time 10    Period Weeks    Status New    Target Date 05/12/21             Plan - 03/29/21 1230    Clinical Impression Statement Pt with no motion sickness sx with standing/seated VORx1 and seated VORx2 with plain background. Pt did have the most sx with supine VORx1. Pt also with increased gait variability when ambulating with horizontal head turns while focusing on ball. The pt will benefit from further skilled therapy to improve balance and sensation of motion sickness.    Personal Factors and Comorbidities Comorbidity 3+;Time since onset of injury/illness/exacerbation    Comorbidities HTN, CVA, DM, hyperlipidemia, generalized anxiety disorder wtih panic attacks    Examination-Activity Limitations Bend    Examination-Participation Restrictions Other   fishing on a boat   Stability/Clinical Decision Making Evolving/Moderate complexity    Rehab Potential Good    PT Frequency 2x / week    PT Duration 12 weeks    PT Treatment/Interventions Canalith Repostioning;Gait training;Stair training;Therapeutic exercise;Therapeutic activities;Balance training;Neuromuscular re-education;Patient/family education;Vestibular;ADLs/Self Care Home Management    PT Next Visit Plan review VOR and progress as able, consider performing head thrust testing and DHI and ABC scale, work on activities with head turns; standing VOR next session; VOR x2; two target VOR, continue supine vorx1    PT Home Exercise Plan VOR X 1 in sitting with plain background 1 minute reps times 3, 3 times a day; HEP via [place] and Agree with  Plan of Care Patient           Patient will benefit from skilled therapeutic intervention in order to improve the following deficits and impairments:  Decreased balance,Difficulty walking,Impaired sensation,Dizziness,Decreased strength,Decreased coordination  Visit Diagnosis: Unsteadiness on feet  Difficulty in  walking, not elsewhere classified     Problem List Patient Active Problem List   Diagnosis Date Noted  . Murmur, cardiac 09/29/2020  . History of CVA (cerebrovascular accident) 12/09/2018  . Anxiety 12/09/2018  . Type 2 diabetes mellitus with neurological complications ([place]) 12/21/457  . Hyperlipidemia 12/06/2018  . Hypertension associated with diabetes ([place]) 12/06/2018  . Right thalamic infarction ([place]) 10/05/2017  . TIA (transient ischemic attack) 08/30/2017   Ricard Dillon PT, DPT 03/29/2021, 12:46 PM  [place] MAIN [facility] SERVICES [address] [place], [place], [address] Phone: [phone]   Fax:  [phone]  Name: Richard PRICHETT Sr. MRN: [id number] Date of Birth: 1951-05-12

## 2021-03-31 ENCOUNTER — Other Ambulatory Visit: Payer: Self-pay

## 2021-03-31 ENCOUNTER — Ambulatory Visit: Payer: PPO

## 2021-03-31 DIAGNOSIS — R42 Dizziness and giddiness: Secondary | ICD-10-CM

## 2021-03-31 DIAGNOSIS — R2689 Other abnormalities of gait and mobility: Secondary | ICD-10-CM | POA: Diagnosis not present

## 2021-03-31 DIAGNOSIS — R2681 Unsteadiness on feet: Secondary | ICD-10-CM

## 2021-03-31 NOTE — Therapy (Signed)
[place] MAIN [facility] SERVICES [address] [place], [place], [address] Phone: [phone]   Fax:  [phone]  Physical Therapy Treatment  Patient Details  Name: Richard ROTHLISBERGER Sr. MRN: [id number] Date of Birth: 07/03/1951 Referring Provider (PT): Dr. Manuella Ghazi   Encounter Date: 03/31/2021   PT End of Session - 03/31/21 0936    Visit Number 6    Number of Visits 25    Date for PT Re-Evaluation 05/16/21    PT Start Time 0800    PT Stop Time 0844    PT Time Calculation (min) 44 min    Equipment Utilized During Treatment Gait belt    Activity Tolerance Patient tolerated treatment well    Behavior During Therapy [facility] for tasks assessed/performed           Past Medical History:  Diagnosis Date  . Arthritis   . Diabetes mellitus without complication ([place])   . GERD (gastroesophageal reflux disease)   . Hemorrhoid   . Hyperlipidemia   . Hypertension   . Stroke [facility])     Past Surgical History:  Procedure Laterality Date  . ANAL FISTULOTOMY N/A 04/30/2015   Procedure: ANAL FISTULOTOMY;  Surgeon: Christene Lye, MD;  Location: [facility] ORS;  Service: General;  Laterality: N/A;  . COLONOSCOPY N/A 04/30/2015   Procedure: COLONOSCOPY;  Surgeon: Christene Lye, MD;  Location: [facility] ORS;  Service: General;  Laterality: N/A;  . TONSILLECTOMY      There were no vitals filed for this visit.   Subjective Assessment - 03/31/21 0849    Subjective Pt continues to report slight improvement with dizziness sx when performing his work duties. Pt reports he sometimes feels light-headed when sitting up which he thinks is due to his blood pressure medication.    Pertinent History Pt suffered a right thalamic infarct on 08/30/2017 with left sided weakness numbness. Pt reports he still has some decreased sensation on the left side of his face and left hand. Pt reports that he lost his ability to taste after the stroke, but that this has improved somewhat. Pt  states that he has difficulty holding a cup of coffee in his left hand and that he feels like when he is walking that he sometime notices that he is stepping down hard with left leg as compared to right. Pt reports he has had dizziness for the past 8-10 years, but reports it has gotten worse in the past 2 years. Pt states that he does not get spinning, but that he has been having motion sickness. Pt reports that sometimes when he is driving if he puts on the brakes quickly, he breaks out in a sweat due to dizziness. Pt states that he enjoys fishing, but that he has not been able to go out in the boat because of motion sickness. Pt states that he is all right when the boat is moving, but once the boat stops he gets the sensation of continued movement/rocking and states that it makes him sick to his stomach and he has had episodes of vomiting due to this sensation. Pt states he had mild sea sickness history, but that this has worsened in the past year. Pt states that he can lie down and if he turns over quickly, he gets feeling like he is going to get sick, but denies vertigo. Pt reports that riding in a boat and car that he gets motion sickness upon stopping, rolling over in bed, bending over, looking up  and quick movements can all bring on his symptoms. Pt reports that sitting still and resting as well as Meclizine help to decrease his symptoms. Pt denies vertigo but rather describes his dizziness as unsteadiness, motion sickness and a sensation of aural fullness. Patient's symptoms are motion provoked and intermittent. Pt reports he is getting episodes of dizziness twice a day which last seconds. Pt states he stays still it goes away. Pt reports that he used to have panic and anxiety, but it is better now. Pt reports that he gets headaches about once a week or less. Pt reports no changes in his headaches in the past year. Pt reports he gets a dull pain and denies any history of migraines. Patient has been seen by  PA at ENT physician's office on 12/24/20 with negative ENT evaluation per medical record. Patient states that he Dix-Hallpike testing which was negative. Patient denies having had VNG testing. Per medical record, patient's dizziness is unlikely a peripheral vestibulopathy.    Patient Stated Goals to be able to walk without veering, to be able to go fishing on a boat and to have decreased motion sickness/dizziness    Currently in Pain? No/denies          TREATMENT   Neuro Re-ed:   CGA provided for all the following interventions except supine exercises:  Supine VORx1 with horizontal head turns, plain background, 2x30 sec; pt reports motion sickness; pt reports second round "not as bad" as first.  Supine VORx2 2x30 sec; pt reports minor sx; pt reports no sx with second set.  Bending to pick up and stack cones, NBOS, 6 rounds, decreasing height so pt must bend through greater ROM; pt with brief sx  PT provided education in the form of explanation followed by demo with exercise for proper breathing technique with heavy lifting/avoiding valsalva maneuver. Pt verbalized understanding and returned demo: Lifting 2 8# dumbbells from decreasing height, 6x  Standing VORx1 with vertical head turns WBOS followed by NBOS 2x30 sec for each   Ambulating for balance semi-tandem x multiple reps through gym, straight path; Demo/VC for technique  Walking backward WBOS and forward semi tandem 8x, straight path, VC/demo for technique. Cuing to look up to scan environment.  At support surface: Standing on airex semi tandem 60 sec each LE; decrease in postural stability but no LOB  Standing on airex NBOS 60 sec; no decrease in postural stability   Assessment: Pt reports continued decrease in frequency of dizziness/motion sickness sx at work and with VOR exercises performed in PT. PT also instructed pt in proper breathing technique to avoid valsalva maneuver when lifting heavy objects at work to potentially  decrease dizziness sx further. Pt verbalized understanding and was able to return demo of correct technique. The pt will benefit from further skilled therapy to address dizziness sx and improve dynamic and static balance.      PT Short Term Goals - 03/03/21 1712      PT SHORT TERM GOAL #1   Title Pt will be independent with HEP in order to improve balance and decrease dizziness symptoms in order to decrease fall risk and improve function at home and work.    Time 4    Period Weeks    Status New    Target Date 03/21/21             PT Long Term Goals - 03/03/21 1711      PT LONG TERM GOAL #1   Title Patient will demonstrate  reduced falls risk as evidenced by Dynamic Gait Index (DGI) 21/24 or greater.    Baseline scored 18/24 on 02/21/21;    Time 12    Period Weeks    Status New    Target Date 05/16/21      PT LONG TERM GOAL #2   Title Patient will have improved FOTO score of 6 points or greater in order to demonstrate improvements in patient's ADLs and functional performance.    Baseline scored 52/100 on 02/21/21    Time 12    Period Weeks    Status New    Target Date 05/16/21      PT LONG TERM GOAL #3   Title Patient will report 50% or greater improvement in his symptoms of dizziness and imbalance with provoking motions or positions in order to be able to go fishing.    Baseline patient reports he has not been able to go fishing on a boat as he gets N & V upon the boat stopping movement;    Time 12    Period Weeks    Status New    Target Date 05/16/21      PT LONG TERM GOAL #4   Title Patient will be able to ambulate with head turns without veering or loss of balance in order for patient to safely ambulate about his home and in the community.    Baseline pt reports unsteadiness and noted mild veering with head turns horizontal and vertical on 02/21/21;    Time 12    Period Weeks    Status New    Target Date 05/16/21      PT LONG TERM GOAL #5   Title Patient will reduce  dizziness handicap inventory score to <16, for less dizziness with ADLs and increased safety with home and work tasks.    Baseline 03/03/2021: score 34 (mild handicap)    Time 10    Period Weeks    Status New    Target Date 05/12/21      Additional Long Term Goals   Additional Long Term Goals Yes      PT LONG TERM GOAL #6   Title Patient will increase ABC scale score >80% to demonstrate better functional mobility and better confidence with ADLs.    Baseline 03/03/2021: score 66.25%    Time 10    Period Weeks    Status New    Target Date 05/12/21                 Plan - 03/31/21 0947    Clinical Impression Statement Pt reports continued decrease in frequency of dizziness/motion sickness sx at work and with VOR exercises performed in PT. PT also instructed pt in proper breathing technique to avoid valsalva maneuver when lifting heavy objects at work to potentially decrease dizziness sx further. Pt verbalized understanding and was able to return demo of correct technique. The pt will benefit from further skilled therapy to address dizziness sx and improve dynamic and static balance.    Personal Factors and Comorbidities Comorbidity 3+;Time since onset of injury/illness/exacerbation    Comorbidities HTN, CVA, DM, hyperlipidemia, generalized anxiety disorder wtih panic attacks    Examination-Activity Limitations [place]    Examination-Participation Restrictions Other   fishing on a boat   Stability/Clinical Decision Making Evolving/Moderate complexity    Rehab Potential Good    PT Frequency 2x / week    PT Duration 12 weeks    PT Treatment/Interventions Canalith Repostioning;Gait training;Stair training;Therapeutic exercise;Therapeutic activities;Balance  training;Neuromuscular re-education;Patient/family education;Vestibular;ADLs/Self Care Home Management    PT Next Visit Plan dynamic balance tasks    PT Home Exercise Plan VOR X 1 in sitting with plain background 1 minute reps times 3, 3  times a day; HEP via [place] and Agree with Plan of Care Patient           Patient will benefit from skilled therapeutic intervention in order to improve the following deficits and impairments:  Decreased balance,Difficulty walking,Impaired sensation,Dizziness,Decreased strength,Decreased coordination  Visit Diagnosis: Other abnormalities of gait and mobility  Unsteadiness on feet  Dizziness and giddiness     Problem List Patient Active Problem List   Diagnosis Date Noted  . Murmur, cardiac 09/29/2020  . History of CVA (cerebrovascular accident) 12/09/2018  . Anxiety 12/09/2018  . Type 2 diabetes mellitus with neurological complications ([place]) 17/49/4496  . Hyperlipidemia 12/06/2018  . Hypertension associated with diabetes ([place]) 12/06/2018  . Right thalamic infarction ([place]) 10/05/2017  . TIA (transient ischemic attack) 08/30/2017   Ricard Dillon PT, DPT 03/31/2021, 9:48 AM  [place] MAIN [facility] SERVICES [address] [place], [place], [address] Phone: [phone]   Fax:  [phone]  Name: Richard CAREY Sr. MRN: [id number] Date of Birth: [birth year]

## 2021-04-05 ENCOUNTER — Ambulatory Visit: Payer: PPO

## 2021-04-07 ENCOUNTER — Ambulatory Visit: Payer: PPO

## 2021-04-12 ENCOUNTER — Ambulatory Visit: Payer: PPO

## 2021-04-14 ENCOUNTER — Ambulatory Visit: Payer: PPO

## 2021-04-22 DIAGNOSIS — E119 Type 2 diabetes mellitus without complications: Secondary | ICD-10-CM | POA: Diagnosis not present

## 2021-04-22 DIAGNOSIS — H5203 Hypermetropia, bilateral: Secondary | ICD-10-CM | POA: Diagnosis not present

## 2021-04-22 DIAGNOSIS — H2513 Age-related nuclear cataract, bilateral: Secondary | ICD-10-CM | POA: Diagnosis not present

## 2021-04-27 DIAGNOSIS — J019 Acute sinusitis, unspecified: Secondary | ICD-10-CM | POA: Diagnosis not present

## 2021-04-27 DIAGNOSIS — R058 Other specified cough: Secondary | ICD-10-CM | POA: Diagnosis not present

## 2021-04-27 DIAGNOSIS — J029 Acute pharyngitis, unspecified: Secondary | ICD-10-CM | POA: Diagnosis not present

## 2021-04-27 DIAGNOSIS — R112 Nausea with vomiting, unspecified: Secondary | ICD-10-CM | POA: Diagnosis not present

## 2021-05-11 DIAGNOSIS — K219 Gastro-esophageal reflux disease without esophagitis: Secondary | ICD-10-CM | POA: Diagnosis not present

## 2021-05-11 DIAGNOSIS — Z8673 Personal history of transient ischemic attack (TIA), and cerebral infarction without residual deficits: Secondary | ICD-10-CM | POA: Diagnosis not present

## 2021-05-11 DIAGNOSIS — Z86010 Personal history of colonic polyps: Secondary | ICD-10-CM | POA: Diagnosis not present

## 2021-05-30 ENCOUNTER — Other Ambulatory Visit: Payer: Self-pay

## 2021-05-30 NOTE — Telephone Encounter (Signed)
Refill request for HCTZ 25 mg take 1 tab daily.   LOV 10/26/20 with Noemi Chapel, NP  No up coming appt   Has not been seen by new PCP Dr. Neomia Dear.

## 2021-05-31 MED ORDER — HYDROCHLOROTHIAZIDE 25 MG PO TABS
25.0000 mg | ORAL_TABLET | Freq: Every day | ORAL | 0 refills | Status: AC
Start: 2021-05-31 — End: ?

## 2021-06-17 ENCOUNTER — Other Ambulatory Visit: Payer: Self-pay | Admitting: Family Medicine

## 2021-06-27 ENCOUNTER — Other Ambulatory Visit: Payer: Self-pay | Admitting: Internal Medicine

## 2021-06-27 DIAGNOSIS — Z1159 Encounter for screening for other viral diseases: Secondary | ICD-10-CM | POA: Diagnosis not present

## 2021-06-27 DIAGNOSIS — E1149 Type 2 diabetes mellitus with other diabetic neurological complication: Secondary | ICD-10-CM | POA: Diagnosis not present

## 2021-06-27 DIAGNOSIS — Z125 Encounter for screening for malignant neoplasm of prostate: Secondary | ICD-10-CM | POA: Diagnosis not present

## 2021-06-27 DIAGNOSIS — I152 Hypertension secondary to endocrine disorders: Secondary | ICD-10-CM | POA: Diagnosis not present

## 2021-06-27 DIAGNOSIS — E785 Hyperlipidemia, unspecified: Secondary | ICD-10-CM | POA: Diagnosis not present

## 2021-06-27 DIAGNOSIS — E1159 Type 2 diabetes mellitus with other circulatory complications: Secondary | ICD-10-CM | POA: Diagnosis not present

## 2021-06-27 NOTE — Telephone Encounter (Signed)
Requested medications are due for refill today yes  Requested medications are on the active medication list yes  Last refill 6/14  Last visit 10/2020  Future visit scheduled no  Notes to clinic Has already had a curtesy refill and there is no upcoming appointment scheduled.

## 2021-06-30 NOTE — Telephone Encounter (Signed)
Per last note pt was no longer a pt here will call pt to ask if he is till a pt here at [facility] as he is due for f.u for this med refill

## 2021-07-04 DIAGNOSIS — I639 Cerebral infarction, unspecified: Secondary | ICD-10-CM | POA: Diagnosis not present

## 2021-07-04 DIAGNOSIS — E1149 Type 2 diabetes mellitus with other diabetic neurological complication: Secondary | ICD-10-CM | POA: Diagnosis not present

## 2021-07-04 DIAGNOSIS — E785 Hyperlipidemia, unspecified: Secondary | ICD-10-CM | POA: Diagnosis not present

## 2021-07-04 DIAGNOSIS — E1159 Type 2 diabetes mellitus with other circulatory complications: Secondary | ICD-10-CM | POA: Diagnosis not present

## 2021-07-04 DIAGNOSIS — I152 Hypertension secondary to endocrine disorders: Secondary | ICD-10-CM | POA: Diagnosis not present

## 2021-07-04 DIAGNOSIS — F419 Anxiety disorder, unspecified: Secondary | ICD-10-CM | POA: Diagnosis not present

## 2021-07-07 NOTE — Telephone Encounter (Signed)
Vm not connected Per last note pt was no longer a pt here will call pt to ask if he is till a pt here at [facility] as he is due for f.u for this med refill

## 2021-07-08 NOTE — Telephone Encounter (Signed)
3rd attempt Vm not connected Per last note pt was no longer a pt here will call pt to ask if he is till a pt here at [facility] as he is due for f.u for this med refill

## 2021-07-15 DIAGNOSIS — K5730 Diverticulosis of large intestine without perforation or abscess without bleeding: Secondary | ICD-10-CM | POA: Diagnosis not present

## 2021-07-15 DIAGNOSIS — K635 Polyp of colon: Secondary | ICD-10-CM | POA: Diagnosis not present

## 2021-07-15 DIAGNOSIS — Z86010 Personal history of colonic polyps: Secondary | ICD-10-CM | POA: Diagnosis not present

## 2021-07-15 DIAGNOSIS — D124 Benign neoplasm of descending colon: Secondary | ICD-10-CM | POA: Diagnosis not present

## 2021-07-15 DIAGNOSIS — D122 Benign neoplasm of ascending colon: Secondary | ICD-10-CM | POA: Diagnosis not present

## 2021-07-18 ENCOUNTER — Other Ambulatory Visit: Payer: Self-pay | Admitting: Family Medicine

## 2021-07-22 ENCOUNTER — Other Ambulatory Visit: Payer: Self-pay | Admitting: Family Medicine

## 2021-07-25 ENCOUNTER — Other Ambulatory Visit: Payer: Self-pay | Admitting: Nurse Practitioner

## 2021-09-12 ENCOUNTER — Other Ambulatory Visit: Payer: Self-pay | Admitting: Family Medicine

## 2021-09-12 NOTE — Telephone Encounter (Signed)
Requested medications are due for refill today yes  Requested medications are on the active medication list yes  Last refill 06/17/21  Last visit 11/04/20  Future visit scheduled no  Notes to clinic Please assess, pt last seen by Noemi Chapel, NP in Nov 2021.

## 2021-10-17 ENCOUNTER — Other Ambulatory Visit: Payer: Self-pay | Admitting: Family Medicine

## 2021-10-17 NOTE — Telephone Encounter (Signed)
Requested medications are due for refill today yes  Requested medications are on the active medication list yes  Last refill 09/19/21  Last visit 10/26/20  Future visit scheduled NO  Notes to clinic labs failed protocol of being within 180 days, labs from 09/2020, please assess.  Requested Prescriptions  Pending Prescriptions Disp Refills   losartan (COZAAR) 100 MG tablet [Pharmacy Med Name: LOSARTAN POTASSIUM 100 MG TAB] 30 tablet 0    Sig: Take 1 tablet (100 mg total) by mouth daily.     Cardiovascular:  Angiotensin Receptor Blockers Failed - 10/17/2021  6:21 PM      Failed - Cr in normal range and within 180 days    Creatinine  Date Value Ref Range Status  11/26/2014 0.98 0.60 - 1.30 mg/dL Final   Creatinine, Ser  Date Value Ref Range Status  09/28/2020 1.23 0.76 - 1.27 mg/dL Final          Failed - K in normal range and within 180 days    Potassium  Date Value Ref Range Status  09/28/2020 4.4 3.5 - 5.2 mmol/L Final  11/26/2014 4.4 3.5 - 5.1 mmol/L Final          Failed - Valid encounter within last 6 months    Recent Outpatient Visits           11 months ago Hypertension associated with diabetes ([place])   [place] Eulogio Bear, NP   1 year ago Type 2 diabetes mellitus with neurological complications ([place])   [place], Jessica A, NP   1 year ago Type 2 diabetes mellitus with neurological complications [facility])   [facility] Volney American, [place]   1 year ago Nausea   [facility] Merrie Roof [place], [place]   1 year ago Type 2 diabetes mellitus with neurological complications [facility])   [place], [place], [place]              Passed - Patient is not pregnant      Passed - Last BP in normal range    BP Readings from Last 1 Encounters:  10/26/20 125/66

## 2021-10-27 DIAGNOSIS — M1712 Unilateral primary osteoarthritis, left knee: Secondary | ICD-10-CM | POA: Diagnosis not present

## 2021-10-28 DIAGNOSIS — E785 Hyperlipidemia, unspecified: Secondary | ICD-10-CM | POA: Diagnosis not present

## 2021-10-28 DIAGNOSIS — F419 Anxiety disorder, unspecified: Secondary | ICD-10-CM | POA: Diagnosis not present

## 2021-10-28 DIAGNOSIS — I6381 Other cerebral infarction due to occlusion or stenosis of small artery: Secondary | ICD-10-CM | POA: Diagnosis not present

## 2021-10-28 DIAGNOSIS — E1159 Type 2 diabetes mellitus with other circulatory complications: Secondary | ICD-10-CM | POA: Diagnosis not present

## 2021-10-28 DIAGNOSIS — I152 Hypertension secondary to endocrine disorders: Secondary | ICD-10-CM | POA: Diagnosis not present

## 2021-10-28 DIAGNOSIS — E1149 Type 2 diabetes mellitus with other diabetic neurological complication: Secondary | ICD-10-CM | POA: Diagnosis not present

## 2021-11-04 DIAGNOSIS — E1149 Type 2 diabetes mellitus with other diabetic neurological complication: Secondary | ICD-10-CM | POA: Diagnosis not present

## 2021-11-04 DIAGNOSIS — Z8673 Personal history of transient ischemic attack (TIA), and cerebral infarction without residual deficits: Secondary | ICD-10-CM | POA: Diagnosis not present

## 2021-11-04 DIAGNOSIS — E785 Hyperlipidemia, unspecified: Secondary | ICD-10-CM | POA: Diagnosis not present

## 2021-11-04 DIAGNOSIS — I152 Hypertension secondary to endocrine disorders: Secondary | ICD-10-CM | POA: Diagnosis not present

## 2021-11-04 DIAGNOSIS — E1159 Type 2 diabetes mellitus with other circulatory complications: Secondary | ICD-10-CM | POA: Diagnosis not present

## 2021-11-04 DIAGNOSIS — R61 Generalized hyperhidrosis: Secondary | ICD-10-CM | POA: Diagnosis not present

## 2021-11-14 ENCOUNTER — Other Ambulatory Visit: Payer: Self-pay | Admitting: Family Medicine

## 2021-11-16 NOTE — Telephone Encounter (Signed)
Requested medications are due for refill today.  yes  Requested medications are on the active medications list. yes  Last refill. 10/18/2021  Future visit scheduled.   no  Notes to clinic.  Labs are expired. Pt is more than 3 months overdue for an office visit. Courtesy refill given.

## 2021-11-23 DIAGNOSIS — M1712 Unilateral primary osteoarthritis, left knee: Secondary | ICD-10-CM | POA: Diagnosis not present

## 2021-12-26 ENCOUNTER — Other Ambulatory Visit: Payer: Self-pay | Admitting: Family Medicine

## 2021-12-26 NOTE — Telephone Encounter (Signed)
Requested medications are due for refill today.  yes  Requested medications are on the active medications list.  yes  Last refill. 11/16/2021  Future visit scheduled.   no  Notes to clinic.  Failed protocol d/t expired labs. Pt already given courtesy refill.    Requested Prescriptions  Pending Prescriptions Disp Refills   losartan (COZAAR) 100 MG tablet [Pharmacy Med Name: LOSARTAN POTASSIUM 100 MG TAB] 30 tablet 0    Sig: Take 1 tablet (100 mg total) by mouth daily.     Cardiovascular:  Angiotensin Receptor Blockers Failed - 12/26/2021 10:36 AM      Failed - Cr in normal range and within 180 days    Creatinine  Date Value Ref Range Status  11/26/2014 0.98 0.60 - 1.30 mg/dL Final   Creatinine, Ser  Date Value Ref Range Status  09/28/2020 1.23 0.76 - 1.27 mg/dL Final          Failed - K in normal range and within 180 days    Potassium  Date Value Ref Range Status  09/28/2020 4.4 3.5 - 5.2 mmol/L Final  11/26/2014 4.4 3.5 - 5.1 mmol/L Final          Failed - Valid encounter within last 6 months    Recent Outpatient Visits           1 year ago Hypertension associated with diabetes ([place])   [place] Eulogio Bear, NP   1 year ago Type 2 diabetes mellitus with neurological complications ([place])   [place], Jessica A, NP   1 year ago Type 2 diabetes mellitus with neurological complications [facility])   [facility] Volney American, [place]   1 year ago Nausea   [facility] Merrie Roof [place], [place]   1 year ago Type 2 diabetes mellitus with neurological complications [facility])   [place], [place], [place]              Passed - Patient is not pregnant      Passed - Last BP in normal range    BP Readings from Last 1 Encounters:  10/26/20 125/66

## 2021-12-27 NOTE — Telephone Encounter (Signed)
Unable to leave vm to schedule an appt with pt.

## 2021-12-28 NOTE — Telephone Encounter (Signed)
2nd attempt- Unable to leave vm to schedule an appt. °

## 2021-12-29 NOTE — Telephone Encounter (Signed)
Spoke with pt and he states he no longer comes to our office. Routing back to sender.

## 2022-01-02 ENCOUNTER — Other Ambulatory Visit: Payer: Self-pay | Admitting: Family Medicine

## 2022-01-02 NOTE — Telephone Encounter (Signed)
Called patient regarding Rx refill. No answer, voicemail box not set up . Unable to leave message. Calling to confirm if established care with new provider .

## 2022-01-02 NOTE — Telephone Encounter (Signed)
Requested medication (s) are due for refill today: see note  Requested medication (s) are on the active medication list: yes  Last refill:  11/16/21 #30 0 refills   Future visit scheduled: no  Notes to clinic:  no valid encounter within 1 year. Future visit scheduled with new provider. Do you want to refill Rx?     Requested Prescriptions  Pending Prescriptions Disp Refills   losartan (COZAAR) 100 MG tablet [Pharmacy Med Name: LOSARTAN POTASSIUM 100 MG TAB] 30 tablet 0    Sig: Take 1 tablet (100 mg total) by mouth daily.     Cardiovascular:  Angiotensin Receptor Blockers Failed - 01/02/2022 12:02 PM      Failed - Cr in normal range and within 180 days    Creatinine  Date Value Ref Range Status  11/26/2014 0.98 0.60 - 1.30 mg/dL Final   Creatinine, Ser  Date Value Ref Range Status  09/28/2020 1.23 0.76 - 1.27 mg/dL Final          Failed - K in normal range and within 180 days    Potassium  Date Value Ref Range Status  09/28/2020 4.4 3.5 - 5.2 mmol/L Final  11/26/2014 4.4 3.5 - 5.1 mmol/L Final          Failed - Valid encounter within last 6 months    Recent Outpatient Visits           1 year ago Hypertension associated with diabetes ([place])   [place] Eulogio Bear, NP   1 year ago Type 2 diabetes mellitus with neurological complications ([place])   [place], Jessica A, NP   1 year ago Type 2 diabetes mellitus with neurological complications [facility])   [facility] Volney American, [place]   1 year ago Nausea   [facility] Merrie Roof [place], [place]   1 year ago Type 2 diabetes mellitus with neurological complications [facility])   [employer], [place], [place]              Passed - Patient is not pregnant      Passed - Last BP in normal range    BP Readings from Last 1 Encounters:  10/26/20 125/66

## 2022-01-03 NOTE — Telephone Encounter (Signed)
See note. Patient is no longer using [facility]. Requested Prescriptions  Pending Prescriptions Disp Refills   losartan (COZAAR) 100 MG tablet [Pharmacy Med Name: LOSARTAN POTASSIUM 100 MG TAB] 30 tablet 0    Sig: Take 1 tablet (100 mg total) by mouth daily.     Cardiovascular:  Angiotensin Receptor Blockers Failed - 12/26/2021 10:36 AM      Failed - Cr in normal range and within 180 days    Creatinine  Date Value Ref Range Status  11/26/2014 0.98 0.60 - 1.30 mg/dL Final   Creatinine, Ser  Date Value Ref Range Status  09/28/2020 1.23 0.76 - 1.27 mg/dL Final         Failed - K in normal range and within 180 days    Potassium  Date Value Ref Range Status  09/28/2020 4.4 3.5 - 5.2 mmol/L Final  11/26/2014 4.4 3.5 - 5.1 mmol/L Final         Failed - Valid encounter within last 6 months    Recent Outpatient Visits          1 year ago Hypertension associated with diabetes ([place])   [place] Eulogio Bear, NP   1 year ago Type 2 diabetes mellitus with neurological complications ([place])   [place], Jessica A, NP   1 year ago Type 2 diabetes mellitus with neurological complications [facility])   [facility] Volney American, [place]   1 year ago Nausea   [facility] Merrie Roof [place], [place]   1 year ago Type 2 diabetes mellitus with neurological complications [facility])   [place], [place], [place]             Passed - Patient is not pregnant      Passed - Last BP in normal range    BP Readings from Last 1 Encounters:  10/26/20 125/66

## 2022-01-30 ENCOUNTER — Other Ambulatory Visit: Payer: Self-pay | Admitting: Family Medicine

## 2022-01-31 NOTE — Telephone Encounter (Signed)
Requested medication (s) are due for refill today - yes  Requested medication (s) are on the active medication list -yes  Future visit scheduled -no  Last refill: 11/16/21 #30  Notes to clinic: Request RF: last fill courtesy- may have changed provider/office  Requested Prescriptions  Pending Prescriptions Disp Refills   losartan (COZAAR) 100 MG tablet [Pharmacy Med Name: LOSARTAN POTASSIUM 100 MG TAB] 30 tablet 0    Sig: Take 1 tablet (100 mg total) by mouth daily.     Cardiovascular:  Angiotensin Receptor Blockers Failed - 01/30/2022  6:33 PM      Failed - Cr in normal range and within 180 days    Creatinine  Date Value Ref Range Status  11/26/2014 0.98 0.60 - 1.30 mg/dL Final   Creatinine, Ser  Date Value Ref Range Status  09/28/2020 1.23 0.76 - 1.27 mg/dL Final          Failed - K in normal range and within 180 days    Potassium  Date Value Ref Range Status  09/28/2020 4.4 3.5 - 5.2 mmol/L Final  11/26/2014 4.4 3.5 - 5.1 mmol/L Final          Failed - Valid encounter within last 6 months    Recent Outpatient Visits           1 year ago Hypertension associated with diabetes ([place])   [place] Eulogio Bear, NP   1 year ago Type 2 diabetes mellitus with neurological complications ([place])   [place], Jessica A, NP   1 year ago Type 2 diabetes mellitus with neurological complications [facility])   [facility] Volney American, [place]   1 year ago Nausea   [place], [place], [place]   1 year ago Type 2 diabetes mellitus with neurological complications [facility])   [place], [place], [place]              Passed - Patient is not pregnant      Passed - Last BP in normal range    BP Readings from Last 1 Encounters:  10/26/20 125/66             Requested Prescriptions  Pending Prescriptions Disp Refills   losartan (COZAAR) 100 MG tablet [Pharmacy Med  Name: LOSARTAN POTASSIUM 100 MG TAB] 30 tablet 0    Sig: Take 1 tablet (100 mg total) by mouth daily.     Cardiovascular:  Angiotensin Receptor Blockers Failed - 01/30/2022  6:33 PM      Failed - Cr in normal range and within 180 days    Creatinine  Date Value Ref Range Status  11/26/2014 0.98 0.60 - 1.30 mg/dL Final   Creatinine, Ser  Date Value Ref Range Status  09/28/2020 1.23 0.76 - 1.27 mg/dL Final          Failed - K in normal range and within 180 days    Potassium  Date Value Ref Range Status  09/28/2020 4.4 3.5 - 5.2 mmol/L Final  11/26/2014 4.4 3.5 - 5.1 mmol/L Final          Failed - Valid encounter within last 6 months    Recent Outpatient Visits           1 year ago Hypertension associated with diabetes [facility])   [place] Eulogio Bear, NP   1 year ago Type 2 diabetes mellitus with neurological complications ([place])   [place] Noemi [place]  A, NP   1 year ago Type 2 diabetes mellitus with neurological complications [facility])   [facility] Volney American, [place]   1 year ago Nausea   [facility] Merrie Roof [place], [place]   1 year ago Type 2 diabetes mellitus with neurological complications [facility])   [place], [place], [place]              Passed - Patient is not pregnant      Passed - Last BP in normal range    BP Readings from Last 1 Encounters:  10/26/20 125/66

## 2022-02-01 NOTE — Telephone Encounter (Signed)
Spoke with patient, he has changed his PCP and will follow up with the pharmacy and his current PCP for medication.
